# Patient Record
Sex: Female | Born: 1950 | Race: White | Hispanic: No | Smoking: Former smoker
Health system: Southern US, Community
[De-identification: ages and names within clinical notes are randomized; demographics above are authoritative.]

## PROBLEM LIST (undated history)

## (undated) DIAGNOSIS — I499 Cardiac arrhythmia, unspecified: Secondary | ICD-10-CM

## (undated) DIAGNOSIS — F419 Anxiety disorder, unspecified: Secondary | ICD-10-CM

## (undated) DIAGNOSIS — I4891 Unspecified atrial fibrillation: Secondary | ICD-10-CM

## (undated) DIAGNOSIS — F32A Depression, unspecified: Secondary | ICD-10-CM

## (undated) DIAGNOSIS — F329 Major depressive disorder, single episode, unspecified: Secondary | ICD-10-CM

## (undated) DIAGNOSIS — I10 Essential (primary) hypertension: Secondary | ICD-10-CM

## (undated) HISTORY — PX: TONSILLECTOMY: SUR1361

## (undated) HISTORY — PX: TUBAL LIGATION: SHX77

---

## 2010-09-10 HISTORY — PX: RETINAL DETACHMENT SURGERY: SHX105

## 2013-10-14 ENCOUNTER — Other Ambulatory Visit (HOSPITAL_COMMUNITY): Payer: Self-pay | Admitting: Family Medicine

## 2013-10-14 DIAGNOSIS — Z139 Encounter for screening, unspecified: Secondary | ICD-10-CM

## 2013-10-16 ENCOUNTER — Ambulatory Visit (HOSPITAL_COMMUNITY)
Admission: RE | Admit: 2013-10-16 | Discharge: 2013-10-16 | Disposition: A | Discharge status: Home / Self Care | Payer: Medicare Other | Source: Ambulatory Visit | Attending: Family Medicine | Admitting: Family Medicine

## 2013-10-16 DIAGNOSIS — Z1231 Encounter for screening mammogram for malignant neoplasm of breast: Secondary | ICD-10-CM | POA: Insufficient documentation

## 2013-10-16 DIAGNOSIS — Z139 Encounter for screening, unspecified: Secondary | ICD-10-CM

## 2013-10-20 ENCOUNTER — Other Ambulatory Visit: Payer: Self-pay | Admitting: Family Medicine

## 2013-10-20 DIAGNOSIS — R928 Other abnormal and inconclusive findings on diagnostic imaging of breast: Secondary | ICD-10-CM

## 2013-11-03 ENCOUNTER — Other Ambulatory Visit: Payer: Self-pay | Admitting: Family Medicine

## 2013-11-03 ENCOUNTER — Ambulatory Visit (HOSPITAL_COMMUNITY)
Admission: RE | Admit: 2013-11-03 | Discharge: 2013-11-03 | Disposition: A | Discharge status: Home / Self Care | Payer: Medicare Other | Source: Ambulatory Visit | Attending: Family Medicine | Admitting: Family Medicine

## 2013-11-03 ENCOUNTER — Encounter (HOSPITAL_COMMUNITY): Payer: Self-pay

## 2013-11-03 ENCOUNTER — Other Ambulatory Visit (HOSPITAL_COMMUNITY): Payer: Self-pay | Admitting: Family Medicine

## 2013-11-03 VITALS — BP 178/72 | HR 60 | Temp 98.5°F | Resp 16

## 2013-11-03 DIAGNOSIS — R928 Other abnormal and inconclusive findings on diagnostic imaging of breast: Secondary | ICD-10-CM

## 2013-11-03 DIAGNOSIS — IMO0002 Reserved for concepts with insufficient information to code with codable children: Secondary | ICD-10-CM

## 2013-11-03 DIAGNOSIS — D249 Benign neoplasm of unspecified breast: Secondary | ICD-10-CM | POA: Diagnosis not present

## 2013-11-03 DIAGNOSIS — N63 Unspecified lump in unspecified breast: Secondary | ICD-10-CM | POA: Diagnosis present

## 2013-11-03 DIAGNOSIS — R229 Localized swelling, mass and lump, unspecified: Principal | ICD-10-CM

## 2013-11-03 HISTORY — DX: Essential (primary) hypertension: I10

## 2013-11-03 MED ORDER — LIDOCAINE HCL (PF) 2 % IJ SOLN
INTRAMUSCULAR | Status: AC
  Administered 2013-11-03: 10 mL
  Filled 2013-11-03: qty 10

## 2013-11-03 NOTE — Discharge Instructions (Signed)
Breast Biopsy °Care After °These instructions give you information on caring for yourself after your procedure. Your doctor may also give you more specific instructions. Call your doctor if you have any problems or questions after your procedure. °HOME CARE °· Only take medicine as told by your doctor. °· Do not take aspirin. °· Keep your sutures (stitches) dry when bathing. °· Protect the biopsy area. Do not let the area get bumped. °· Avoid activities that could pull the biopsy site open until your doctor approves. This includes: °· Stretching. °· Reaching. °· Exercise. °· Sports. °· Lifting more than 3lb. °· Continue your normal diet. °· Wear a good support bra for as long as told by your doctor. °· Change any bandages (dressings) as told by your doctor. °· Do not drink alcohol while taking pain medicine. °· Keep all doctor visits as told. Ask when your test results will be ready. Make sure you get your test results. °GET HELP RIGHT AWAY IF:  °· You have a fever. °· You have more bleeding (more than a small spot) from the biopsy site. °· You have trouble breathing. °· You have yellowish-white fluid (pus) coming from the biopsy site. °· You have redness, puffiness (swelling), or more pain in the biopsy site. °· You have a bad smell coming from the biopsy site. °· Your biopsy site opens after sutures, staples, or sticky strips have been removed. °· You have a rash. °· You need stronger medicine. °MAKE SURE YOU: °· Understand these instructions. °· Will watch your condition. °· Will get help right away if you are not doing well or get worse. °Document Released: 12/23/2008 Document Revised: 05/21/2011 Document Reviewed: 04/08/2011 °ExitCare® Patient Information ©2015 [employer]. This information is not intended to replace advice given to you by your health care provider. Make sure you discuss any questions you have with your health care provider. ° °Breast Biopsy °A breast biopsy is a test during which a sample of  tissue is taken from your breast. The breast tissue is looked at under a microscope for cancer cells.  °BEFORE THE PROCEDURE °· Make plans to have someone drive you home after the test. °· Do not smoke for 2 weeks before the test. Stop smoking, if you smoke. °· Do not drink alcohol for 24 hours before the test. °· Wear a good support bra to the test. °PROCEDURE  °You may be given one of the following: °· A medicine to numb the breast area (local anesthetic). °· A medicine to make you fall asleep (general anesthetic). °There are different types of breast biopsies. They include: °· Fine-needle aspiration. °¨ A needle is put into the breast lump. °¨ The needle takes out fluid and cells from the lump. °¨ Ultrasound imaging may be used to help find the lump and to put the needle in the right spot. °· Core-needle biopsy. °¨ A needle is put into the breast lump. °¨ The needle is put in your breast 3-6 times. °¨ The needle removes breast tissue. °¨ An ultrasound image or X-ray is often used to find the right spot to put in the needle. °· Stereotactic biopsy. °¨ X-rays and a computer are used to study X-ray pictures of the breast lump. °¨ The computer finds where the needle needs to be put into the breast. °¨ Tissue samples are taken out. °· Vacuum-assisted biopsy. °¨ A small cut (incision) is made in your breast. °¨ A biopsy device is put through the cut and into the breast   tissue. °¨ The biopsy device draws abnormal breast tissue into the biopsy device. °¨ A large tissue sample is often removed. °¨ No stitches are needed. °· Ultrasound-guided core-needle biopsy. °¨ Ultrasound imaging helps guide the needle into the area of the breast that is not normal. °¨ A cut is made in the breast. The needle is put into the breast lump. °¨ Tissue samples are taken out. °· Open biopsy. °¨ A large cut is made in the breast. °¨ Your doctor will try to remove the whole breast lump or as much as possible. °All tissue, fluid, or cell samples  are looked at under a microscope.  °AFTER THE PROCEDURE °· You will be taken to an area to recover. You will be able to go home once you are doing well and are without problems. °· You may have bruising on your breast. This is normal. °· A pressure bandage (dressing) may be put on your breast for 24-48 hours. This type of bandage is wrapped tightly around your chest. It helps stop fluid from building up underneath tissues. °Document Released: 05/21/2011 Document Revised: 07/13/2013 Document Reviewed: 05/21/2011 °ExitCare® Patient Information ©2015 [employer]. This information is not intended to replace advice given to you by your health care provider. Make sure you discuss any questions you have with your health care provider. ° °

## 2013-11-05 HISTORY — PX: BREAST BIOPSY: SHX20

## 2014-04-15 ENCOUNTER — Other Ambulatory Visit (HOSPITAL_COMMUNITY): Payer: Self-pay | Admitting: Family Medicine

## 2014-04-15 DIAGNOSIS — Z09 Encounter for follow-up examination after completed treatment for conditions other than malignant neoplasm: Secondary | ICD-10-CM

## 2014-04-15 DIAGNOSIS — N6310 Unspecified lump in the right breast, unspecified quadrant: Secondary | ICD-10-CM

## 2014-04-15 DIAGNOSIS — D241 Benign neoplasm of right breast: Secondary | ICD-10-CM

## 2014-05-11 ENCOUNTER — Ambulatory Visit (HOSPITAL_COMMUNITY)
Admission: RE | Admit: 2014-05-11 | Discharge: 2014-05-11 | Disposition: A | Discharge status: Home / Self Care | Payer: Medicare Other | Source: Ambulatory Visit | Attending: Family Medicine | Admitting: Family Medicine

## 2014-05-11 DIAGNOSIS — N63 Unspecified lump in breast: Secondary | ICD-10-CM | POA: Insufficient documentation

## 2014-05-11 DIAGNOSIS — R928 Other abnormal and inconclusive findings on diagnostic imaging of breast: Secondary | ICD-10-CM | POA: Diagnosis not present

## 2014-05-11 DIAGNOSIS — Z09 Encounter for follow-up examination after completed treatment for conditions other than malignant neoplasm: Secondary | ICD-10-CM

## 2014-05-11 DIAGNOSIS — D241 Benign neoplasm of right breast: Secondary | ICD-10-CM

## 2014-09-22 ENCOUNTER — Other Ambulatory Visit (HOSPITAL_COMMUNITY): Payer: Self-pay | Admitting: Family Medicine

## 2014-09-22 DIAGNOSIS — Z6827 Body mass index (BMI) 27.0-27.9, adult: Secondary | ICD-10-CM | POA: Diagnosis not present

## 2014-09-22 DIAGNOSIS — I10 Essential (primary) hypertension: Secondary | ICD-10-CM | POA: Diagnosis not present

## 2014-09-22 DIAGNOSIS — F329 Major depressive disorder, single episode, unspecified: Secondary | ICD-10-CM | POA: Diagnosis not present

## 2014-09-22 DIAGNOSIS — Z1389 Encounter for screening for other disorder: Secondary | ICD-10-CM

## 2014-09-22 DIAGNOSIS — Z0000 Encounter for general adult medical examination without abnormal findings: Secondary | ICD-10-CM | POA: Diagnosis not present

## 2014-09-22 DIAGNOSIS — E663 Overweight: Secondary | ICD-10-CM | POA: Diagnosis not present

## 2014-09-28 ENCOUNTER — Ambulatory Visit (HOSPITAL_COMMUNITY)
Admission: RE | Admit: 2014-09-28 | Discharge: 2014-09-28 | Disposition: A | Discharge status: Home / Self Care | Payer: Medicare Other | Source: Ambulatory Visit | Attending: Family Medicine | Admitting: Family Medicine

## 2014-09-28 DIAGNOSIS — M8580 Other specified disorders of bone density and structure, unspecified site: Secondary | ICD-10-CM | POA: Insufficient documentation

## 2014-09-28 DIAGNOSIS — Z13820 Encounter for screening for osteoporosis: Secondary | ICD-10-CM | POA: Insufficient documentation

## 2014-09-28 DIAGNOSIS — M898X8 Other specified disorders of bone, other site: Secondary | ICD-10-CM | POA: Diagnosis not present

## 2014-09-28 DIAGNOSIS — Z1389 Encounter for screening for other disorder: Secondary | ICD-10-CM

## 2014-11-22 DIAGNOSIS — Z8371 Family history of colonic polyps: Secondary | ICD-10-CM | POA: Diagnosis not present

## 2014-11-22 DIAGNOSIS — R194 Change in bowel habit: Secondary | ICD-10-CM | POA: Diagnosis not present

## 2014-12-01 DIAGNOSIS — Z8249 Family history of ischemic heart disease and other diseases of the circulatory system: Secondary | ICD-10-CM | POA: Diagnosis not present

## 2014-12-01 DIAGNOSIS — Z79899 Other long term (current) drug therapy: Secondary | ICD-10-CM | POA: Diagnosis not present

## 2014-12-01 DIAGNOSIS — Z1211 Encounter for screening for malignant neoplasm of colon: Secondary | ICD-10-CM | POA: Diagnosis not present

## 2014-12-01 DIAGNOSIS — K599 Functional intestinal disorder, unspecified: Secondary | ICD-10-CM | POA: Diagnosis not present

## 2014-12-01 DIAGNOSIS — Z8371 Family history of colonic polyps: Secondary | ICD-10-CM | POA: Diagnosis not present

## 2014-12-01 DIAGNOSIS — K644 Residual hemorrhoidal skin tags: Secondary | ICD-10-CM | POA: Diagnosis not present

## 2014-12-01 DIAGNOSIS — D175 Benign lipomatous neoplasm of intra-abdominal organs: Secondary | ICD-10-CM | POA: Diagnosis not present

## 2014-12-01 DIAGNOSIS — R194 Change in bowel habit: Secondary | ICD-10-CM | POA: Diagnosis not present

## 2014-12-01 DIAGNOSIS — F329 Major depressive disorder, single episode, unspecified: Secondary | ICD-10-CM | POA: Diagnosis not present

## 2014-12-01 DIAGNOSIS — I10 Essential (primary) hypertension: Secondary | ICD-10-CM | POA: Diagnosis not present

## 2015-05-10 DIAGNOSIS — Z79899 Other long term (current) drug therapy: Secondary | ICD-10-CM | POA: Diagnosis not present

## 2015-05-10 DIAGNOSIS — S60222A Contusion of left hand, initial encounter: Secondary | ICD-10-CM | POA: Diagnosis not present

## 2015-05-10 DIAGNOSIS — W07XXXA Fall from chair, initial encounter: Secondary | ICD-10-CM | POA: Diagnosis not present

## 2015-05-10 DIAGNOSIS — M79642 Pain in left hand: Secondary | ICD-10-CM | POA: Diagnosis not present

## 2015-05-10 DIAGNOSIS — I10 Essential (primary) hypertension: Secondary | ICD-10-CM | POA: Diagnosis not present

## 2015-06-15 DIAGNOSIS — H524 Presbyopia: Secondary | ICD-10-CM | POA: Diagnosis not present

## 2015-07-12 DIAGNOSIS — Z1389 Encounter for screening for other disorder: Secondary | ICD-10-CM | POA: Diagnosis not present

## 2015-07-12 DIAGNOSIS — I10 Essential (primary) hypertension: Secondary | ICD-10-CM | POA: Diagnosis not present

## 2015-07-12 DIAGNOSIS — R252 Cramp and spasm: Secondary | ICD-10-CM | POA: Diagnosis not present

## 2015-07-12 DIAGNOSIS — M542 Cervicalgia: Secondary | ICD-10-CM | POA: Diagnosis not present

## 2015-09-27 DIAGNOSIS — C4491 Basal cell carcinoma of skin, unspecified: Secondary | ICD-10-CM | POA: Diagnosis not present

## 2015-09-27 DIAGNOSIS — E782 Mixed hyperlipidemia: Secondary | ICD-10-CM | POA: Diagnosis not present

## 2015-09-27 DIAGNOSIS — R011 Cardiac murmur, unspecified: Secondary | ICD-10-CM | POA: Diagnosis not present

## 2015-09-27 DIAGNOSIS — I10 Essential (primary) hypertension: Secondary | ICD-10-CM | POA: Diagnosis not present

## 2015-09-27 DIAGNOSIS — Z0001 Encounter for general adult medical examination with abnormal findings: Secondary | ICD-10-CM | POA: Diagnosis not present

## 2015-09-27 DIAGNOSIS — Z1389 Encounter for screening for other disorder: Secondary | ICD-10-CM | POA: Diagnosis not present

## 2015-09-27 DIAGNOSIS — L570 Actinic keratosis: Secondary | ICD-10-CM | POA: Diagnosis not present

## 2015-10-04 DIAGNOSIS — R748 Abnormal levels of other serum enzymes: Secondary | ICD-10-CM | POA: Diagnosis not present

## 2015-12-12 DIAGNOSIS — Z23 Encounter for immunization: Secondary | ICD-10-CM | POA: Diagnosis not present

## 2015-12-14 ENCOUNTER — Other Ambulatory Visit (HOSPITAL_COMMUNITY): Payer: Self-pay | Admitting: Family Medicine

## 2015-12-14 DIAGNOSIS — Z1231 Encounter for screening mammogram for malignant neoplasm of breast: Secondary | ICD-10-CM

## 2015-12-22 ENCOUNTER — Ambulatory Visit (HOSPITAL_COMMUNITY)
Admission: RE | Admit: 2015-12-22 | Discharge: 2015-12-22 | Disposition: A | Discharge status: Home / Self Care | Payer: Medicare Other | Source: Ambulatory Visit | Attending: Family Medicine | Admitting: Family Medicine

## 2015-12-22 DIAGNOSIS — Z1231 Encounter for screening mammogram for malignant neoplasm of breast: Secondary | ICD-10-CM

## 2016-08-29 DIAGNOSIS — H524 Presbyopia: Secondary | ICD-10-CM | POA: Diagnosis not present

## 2016-08-29 DIAGNOSIS — H2513 Age-related nuclear cataract, bilateral: Secondary | ICD-10-CM | POA: Diagnosis not present

## 2016-08-29 DIAGNOSIS — H25043 Posterior subcapsular polar age-related cataract, bilateral: Secondary | ICD-10-CM | POA: Diagnosis not present

## 2016-09-18 DIAGNOSIS — H25811 Combined forms of age-related cataract, right eye: Secondary | ICD-10-CM | POA: Diagnosis not present

## 2016-09-18 DIAGNOSIS — H2511 Age-related nuclear cataract, right eye: Secondary | ICD-10-CM | POA: Diagnosis not present

## 2016-09-18 DIAGNOSIS — H25041 Posterior subcapsular polar age-related cataract, right eye: Secondary | ICD-10-CM | POA: Diagnosis not present

## 2016-09-20 DIAGNOSIS — Z1389 Encounter for screening for other disorder: Secondary | ICD-10-CM | POA: Diagnosis not present

## 2016-09-20 DIAGNOSIS — I10 Essential (primary) hypertension: Secondary | ICD-10-CM | POA: Diagnosis not present

## 2016-12-04 DIAGNOSIS — H2512 Age-related nuclear cataract, left eye: Secondary | ICD-10-CM | POA: Diagnosis not present

## 2016-12-04 DIAGNOSIS — H25042 Posterior subcapsular polar age-related cataract, left eye: Secondary | ICD-10-CM | POA: Diagnosis not present

## 2016-12-04 DIAGNOSIS — H25812 Combined forms of age-related cataract, left eye: Secondary | ICD-10-CM | POA: Diagnosis not present

## 2016-12-15 DIAGNOSIS — Z23 Encounter for immunization: Secondary | ICD-10-CM | POA: Diagnosis not present

## 2017-02-28 DIAGNOSIS — Z0001 Encounter for general adult medical examination with abnormal findings: Secondary | ICD-10-CM | POA: Diagnosis not present

## 2017-02-28 DIAGNOSIS — Z1389 Encounter for screening for other disorder: Secondary | ICD-10-CM | POA: Diagnosis not present

## 2017-02-28 DIAGNOSIS — E782 Mixed hyperlipidemia: Secondary | ICD-10-CM | POA: Diagnosis not present

## 2017-02-28 DIAGNOSIS — I10 Essential (primary) hypertension: Secondary | ICD-10-CM | POA: Diagnosis not present

## 2017-03-15 ENCOUNTER — Other Ambulatory Visit (HOSPITAL_COMMUNITY): Payer: Self-pay | Admitting: Family Medicine

## 2017-03-15 DIAGNOSIS — Z1231 Encounter for screening mammogram for malignant neoplasm of breast: Secondary | ICD-10-CM

## 2017-03-18 ENCOUNTER — Ambulatory Visit (HOSPITAL_COMMUNITY)
Admission: RE | Admit: 2017-03-18 | Discharge: 2017-03-18 | Disposition: A | Discharge status: Home / Self Care | Payer: Medicare Other | Source: Ambulatory Visit | Attending: Family Medicine | Admitting: Family Medicine

## 2017-03-18 DIAGNOSIS — Z1231 Encounter for screening mammogram for malignant neoplasm of breast: Secondary | ICD-10-CM | POA: Insufficient documentation

## 2017-06-10 DIAGNOSIS — Z1389 Encounter for screening for other disorder: Secondary | ICD-10-CM | POA: Diagnosis not present

## 2017-06-10 DIAGNOSIS — R05 Cough: Secondary | ICD-10-CM | POA: Diagnosis not present

## 2017-06-19 DIAGNOSIS — N183 Chronic kidney disease, stage 3 (moderate): Secondary | ICD-10-CM | POA: Diagnosis not present

## 2017-06-19 DIAGNOSIS — I10 Essential (primary) hypertension: Secondary | ICD-10-CM | POA: Diagnosis not present

## 2017-07-03 ENCOUNTER — Other Ambulatory Visit: Payer: Self-pay | Admitting: Nephrology

## 2017-07-03 ENCOUNTER — Other Ambulatory Visit (HOSPITAL_COMMUNITY): Payer: Self-pay | Admitting: Nephrology

## 2017-07-03 DIAGNOSIS — N183 Chronic kidney disease, stage 3 unspecified: Secondary | ICD-10-CM

## 2017-07-16 ENCOUNTER — Ambulatory Visit (HOSPITAL_COMMUNITY)
Admission: RE | Admit: 2017-07-16 | Discharge: 2017-07-16 | Disposition: A | Discharge status: Home / Self Care | Payer: Medicare Other | Source: Ambulatory Visit | Attending: Nephrology | Admitting: Nephrology

## 2017-07-16 DIAGNOSIS — N183 Chronic kidney disease, stage 3 unspecified: Secondary | ICD-10-CM

## 2017-07-16 DIAGNOSIS — D509 Iron deficiency anemia, unspecified: Secondary | ICD-10-CM | POA: Diagnosis not present

## 2017-07-16 DIAGNOSIS — N281 Cyst of kidney, acquired: Secondary | ICD-10-CM | POA: Insufficient documentation

## 2017-07-16 DIAGNOSIS — R809 Proteinuria, unspecified: Secondary | ICD-10-CM | POA: Diagnosis not present

## 2017-07-16 DIAGNOSIS — Z1159 Encounter for screening for other viral diseases: Secondary | ICD-10-CM | POA: Diagnosis not present

## 2017-07-16 DIAGNOSIS — Z79899 Other long term (current) drug therapy: Secondary | ICD-10-CM | POA: Diagnosis not present

## 2017-07-16 DIAGNOSIS — I10 Essential (primary) hypertension: Secondary | ICD-10-CM | POA: Diagnosis not present

## 2017-07-16 DIAGNOSIS — E559 Vitamin D deficiency, unspecified: Secondary | ICD-10-CM | POA: Diagnosis not present

## 2017-07-24 DIAGNOSIS — E875 Hyperkalemia: Secondary | ICD-10-CM | POA: Diagnosis not present

## 2017-07-24 DIAGNOSIS — E559 Vitamin D deficiency, unspecified: Secondary | ICD-10-CM | POA: Diagnosis not present

## 2017-07-24 DIAGNOSIS — N183 Chronic kidney disease, stage 3 (moderate): Secondary | ICD-10-CM | POA: Diagnosis not present

## 2017-10-31 DIAGNOSIS — R809 Proteinuria, unspecified: Secondary | ICD-10-CM | POA: Diagnosis not present

## 2017-10-31 DIAGNOSIS — Z79899 Other long term (current) drug therapy: Secondary | ICD-10-CM | POA: Diagnosis not present

## 2017-10-31 DIAGNOSIS — E559 Vitamin D deficiency, unspecified: Secondary | ICD-10-CM | POA: Diagnosis not present

## 2017-10-31 DIAGNOSIS — N183 Chronic kidney disease, stage 3 (moderate): Secondary | ICD-10-CM | POA: Diagnosis not present

## 2017-10-31 DIAGNOSIS — D509 Iron deficiency anemia, unspecified: Secondary | ICD-10-CM | POA: Diagnosis not present

## 2017-10-31 DIAGNOSIS — I10 Essential (primary) hypertension: Secondary | ICD-10-CM | POA: Diagnosis not present

## 2017-11-06 DIAGNOSIS — E559 Vitamin D deficiency, unspecified: Secondary | ICD-10-CM | POA: Diagnosis not present

## 2017-11-06 DIAGNOSIS — N183 Chronic kidney disease, stage 3 (moderate): Secondary | ICD-10-CM | POA: Diagnosis not present

## 2017-12-10 DIAGNOSIS — I4891 Unspecified atrial fibrillation: Secondary | ICD-10-CM

## 2017-12-10 DIAGNOSIS — Z23 Encounter for immunization: Secondary | ICD-10-CM | POA: Diagnosis not present

## 2017-12-10 HISTORY — DX: Unspecified atrial fibrillation: I48.91

## 2018-01-09 DIAGNOSIS — N182 Chronic kidney disease, stage 2 (mild): Secondary | ICD-10-CM | POA: Diagnosis not present

## 2018-01-09 DIAGNOSIS — Z0001 Encounter for general adult medical examination with abnormal findings: Secondary | ICD-10-CM | POA: Diagnosis not present

## 2018-01-09 DIAGNOSIS — R7309 Other abnormal glucose: Secondary | ICD-10-CM | POA: Diagnosis not present

## 2018-01-09 DIAGNOSIS — I10 Essential (primary) hypertension: Secondary | ICD-10-CM | POA: Diagnosis not present

## 2018-01-09 DIAGNOSIS — L919 Hypertrophic disorder of the skin, unspecified: Secondary | ICD-10-CM | POA: Diagnosis not present

## 2018-01-09 DIAGNOSIS — Z1389 Encounter for screening for other disorder: Secondary | ICD-10-CM | POA: Diagnosis not present

## 2018-01-09 DIAGNOSIS — D485 Neoplasm of uncertain behavior of skin: Secondary | ICD-10-CM | POA: Diagnosis not present

## 2018-01-09 DIAGNOSIS — E782 Mixed hyperlipidemia: Secondary | ICD-10-CM | POA: Diagnosis not present

## 2018-01-09 DIAGNOSIS — R739 Hyperglycemia, unspecified: Secondary | ICD-10-CM | POA: Diagnosis not present

## 2018-01-29 DIAGNOSIS — X32XXXA Exposure to sunlight, initial encounter: Secondary | ICD-10-CM | POA: Diagnosis not present

## 2018-01-29 DIAGNOSIS — C44219 Basal cell carcinoma of skin of left ear and external auricular canal: Secondary | ICD-10-CM | POA: Diagnosis not present

## 2018-01-29 DIAGNOSIS — D225 Melanocytic nevi of trunk: Secondary | ICD-10-CM | POA: Diagnosis not present

## 2018-01-29 DIAGNOSIS — L570 Actinic keratosis: Secondary | ICD-10-CM | POA: Diagnosis not present

## 2018-02-17 ENCOUNTER — Encounter: Payer: Self-pay | Admitting: *Deleted

## 2018-02-18 ENCOUNTER — Encounter: Payer: Self-pay | Admitting: Cardiology

## 2018-02-18 ENCOUNTER — Ambulatory Visit: Payer: Medicare Other | Admitting: Cardiology

## 2018-02-18 VITALS — BP 146/90 | HR 92 | Ht 60.0 in | Wt 172.0 lb

## 2018-02-18 DIAGNOSIS — R0609 Other forms of dyspnea: Secondary | ICD-10-CM

## 2018-02-18 DIAGNOSIS — I4891 Unspecified atrial fibrillation: Secondary | ICD-10-CM

## 2018-02-18 DIAGNOSIS — I10 Essential (primary) hypertension: Secondary | ICD-10-CM | POA: Insufficient documentation

## 2018-02-18 DIAGNOSIS — I4819 Other persistent atrial fibrillation: Secondary | ICD-10-CM | POA: Diagnosis not present

## 2018-02-18 MED ORDER — RIVAROXABAN 20 MG PO TABS: 20.0000 mg | ORAL_TABLET | Freq: Every day | ORAL | 0 refills | Status: DC

## 2018-02-18 MED ORDER — RIVAROXABAN 20 MG PO TABS: 20.0000 mg | ORAL_TABLET | Freq: Every day | ORAL | 3 refills | Status: DC

## 2018-02-18 MED ORDER — METOPROLOL TARTRATE 25 MG PO TABS: 25.0000 mg | ORAL_TABLET | Freq: Two times a day (BID) | ORAL | 0 refills | Status: DC

## 2018-02-18 MED ORDER — METOPROLOL TARTRATE 25 MG PO TABS: 25.0000 mg | ORAL_TABLET | Freq: Two times a day (BID) | ORAL | 1 refills | Status: DC

## 2018-02-18 NOTE — Progress Notes (Signed)
     Clinical Summary Stephanie Hale is a 67 y.o.female seen as new consult, referred by Dr Hilma Favors for afib  1.Persistent Afib - new diagnosis during recent pcp visit - no palpitations. Some fatigue, DOE for example unloading groceries. Some weight gain over the same time period.  - did not start xarelto as prescribed by pcp CHADS2Vasc (age, gender, HTN) is 3.  CrCl 55       Past Medical History:  Diagnosis Date  . Hypertension      No Known Allergies   Current Outpatient Medications  Medication Sig Dispense Refill  . hydrochlorothiazide (MICROZIDE) 12.5 MG capsule Take 12.5 mg by mouth daily.    Marland Kitchen losartan (COZAAR) 50 MG tablet Take 50 mg by mouth daily.    . Rivaroxaban (XARELTO PO) Take by mouth.     No current facility-administered medications for this visit.         No Known Allergies    Family History  Problem Relation Age of Onset  . Heart attack Father   . CAD Father   . Cancer Brother      Social History Stephanie Hale has no tobacco history on file. Stephanie Hale has no alcohol history on file.   Review of Systems CONSTITUTIONAL: No weight loss, fever, chills, weakness or fatigue.  HEENT: Eyes: No visual loss, blurred vision, double vision or yellow sclerae.No hearing loss, sneezing, congestion, runny nose or sore throat.  SKIN: No rash or itching.  CARDIOVASCULAR: per hpi RESPIRATORY: per hpi GASTROINTESTINAL: No anorexia, nausea, vomiting or diarrhea. No abdominal pain or blood.  GENITOURINARY: No burning on urination, no polyuria NEUROLOGICAL: No headache, dizziness, syncope, paralysis, ataxia, numbness or tingling in the extremities. No change in bowel or bladder control.  MUSCULOSKELETAL: No muscle, back pain, joint pain or stiffness.  LYMPHATICS: No enlarged nodes. No history of splenectomy.  PSYCHIATRIC: No history of depression or anxiety.  ENDOCRINOLOGIC: No reports of sweating, cold or heat intolerance. No polyuria or polydipsia.   Marland Kitchen   Physical Examination Vitals:   02/18/18 0902 02/18/18 0912  BP: (!) 144/94 (!) 146/90  Pulse: 94 92  SpO2: 98% 96%   Vitals:   02/18/18 0902  Weight: 172 lb (78 kg)  Height: 5' (1.524 m)    Gen: resting comfortably, no acute distress HEENT: no scleral icterus, pupils equal round and reactive, no palptable cervical adenopathy,  CV: irreg, no no m/r/g, no jvd Resp: Clear to auscultation bilaterally GI: abdomen is soft, non-tender, non-distended, normal bowel sounds, no hepatosplenomegaly MSK: extremities are warm, no edema.  Skin: warm, no rash Neuro:  no focal deficits Psych: appropriate affect     Assessment and Plan  1. Persistent afib - new diagnosis recently by pcp. EKG today shows she remains in afib with mildly elevated rate - no specific palpitatiions, but could be related to her DOE and fatigue - start lopressor 25mg  bid - CHADS2Vasc score is 3, start xarelto 20mg  daily.  - nursing visit 3 weeks for vitals and EKG, if still in afib would plan for DCCV - obtain echo   F/u 2 months. If still in afib at nursing visit after 3 weeks of anticoag plan for DCCV         Arnoldo Lenis, M.D.

## 2018-02-18 NOTE — Patient Instructions (Signed)
Your physician recommends that you schedule a follow-up appointment in: [place]  Your physician has recommended you make the following change in your medication:   START LOPRESSOR 25 MG TWICE DAILY   START XARELTO 20 MG DAILY   Your physician has requested that you have an echocardiogram. Echocardiography is a painless test that uses sound waves to create images of your heart. It provides your doctor with information about the size and shape of your heart and how well your heart's chambers and valves are working. This procedure takes approximately one hour. There are no restrictions for this procedure.  Thank you for choosing [facility]!!

## 2018-03-10 DIAGNOSIS — L570 Actinic keratosis: Secondary | ICD-10-CM | POA: Diagnosis not present

## 2018-03-10 DIAGNOSIS — X32XXXD Exposure to sunlight, subsequent encounter: Secondary | ICD-10-CM | POA: Diagnosis not present

## 2018-03-10 DIAGNOSIS — Z08 Encounter for follow-up examination after completed treatment for malignant neoplasm: Secondary | ICD-10-CM | POA: Diagnosis not present

## 2018-03-10 DIAGNOSIS — Z85828 Personal history of other malignant neoplasm of skin: Secondary | ICD-10-CM | POA: Diagnosis not present

## 2018-03-11 ENCOUNTER — Ambulatory Visit (INDEPENDENT_AMBULATORY_CARE_PROVIDER_SITE_OTHER): Payer: Medicare Other | Admitting: *Deleted

## 2018-03-11 ENCOUNTER — Other Ambulatory Visit: Payer: Self-pay

## 2018-03-11 ENCOUNTER — Ambulatory Visit (INDEPENDENT_AMBULATORY_CARE_PROVIDER_SITE_OTHER): Payer: Medicare Other

## 2018-03-11 VITALS — BP 132/84 | HR 79

## 2018-03-11 DIAGNOSIS — I4891 Unspecified atrial fibrillation: Secondary | ICD-10-CM | POA: Diagnosis not present

## 2018-03-11 LAB — ECHOCARDIOGRAM COMPLETE

## 2018-03-11 NOTE — Progress Notes (Signed)
ECG demonstrates rate controlled atrial fibrillation.  As per Dr. Nelly Laurence note, scheduled for direct-current cardioversion with him.

## 2018-03-11 NOTE — Progress Notes (Signed)
Patient in office for vitals & EKG per Dr. Harl Bowie.  EKG scanned into Epic.

## 2018-03-13 ENCOUNTER — Telehealth: Payer: Self-pay | Admitting: *Deleted

## 2018-03-13 NOTE — Telephone Encounter (Signed)
Notes recorded by Laurine Blazer, LPN on 07/16/4933 at 5:21 PM EST Patient notified. Copy to pmd. Follow up scheduled for 2/20 with Dr. Harl Bowie. ------  Notes recorded by Herminio Commons, MD on 03/11/2018 at 4:41 PM EST Normal pumping function with mild mitral valve leakage.

## 2018-03-14 ENCOUNTER — Telehealth: Payer: Self-pay | Admitting: Cardiology

## 2018-03-14 NOTE — Progress Notes (Signed)
Patient notified last evening.

## 2018-03-14 NOTE — Telephone Encounter (Signed)
Patient is requesting that her cardioversion be scheduled on a Friday.

## 2018-03-14 NOTE — Telephone Encounter (Signed)
Patient called stating that she needs to speak with someone about upcoming procedure to be done at [facility].

## 2018-03-21 NOTE — Telephone Encounter (Signed)
Left message to return call 

## 2018-03-21 NOTE — Telephone Encounter (Signed)
Sarita Bottom, LPN        Good Morning,   DCCV is scheduled for Friday 04/04/18 @ 9:30am. Needs to register @ 8:30am at [facility]. Pre Op scheduled for Monday 03/31/18 @ 2:45pm at [facility].   Thanks,  Coralyn Mark   Previous Messages    ----- Message -----  From: Laurine Blazer, LPN  Sent: 03/17/6151  5:46 PM EST  To: Orinda Kenner  Subject: schedule DCCV                  Need to schedule Cardioversion for AFib for Dr. Harl Bowie. Patient is requesting to be done on a Friday.    Thanks,   Edd Fabian

## 2018-03-24 ENCOUNTER — Encounter: Payer: Self-pay | Admitting: *Deleted

## 2018-03-24 ENCOUNTER — Other Ambulatory Visit: Payer: Self-pay | Admitting: Cardiology

## 2018-03-24 NOTE — Telephone Encounter (Signed)
Patient notified and verbalized understanding. 

## 2018-03-24 NOTE — Patient Instructions (Signed)
Stephanie Hale  03/24/2018     @PREFPERIOPPHARMACY @   Your procedure is scheduled on  04/04/2018  Report to [facility] at  800  A.M.  Call this number if you have problems the morning of surgery:  [phone]   Remember:  Do not eat or drink after midnight.                         Take these medicines the morning of surgery with A SIP OF WATER  Zyrtec, losartan.    Do not wear jewelry, make-up or nail polish.  Do not wear lotions, powders, or perfumes, or deodorant.  Do not shave 48 hours prior to surgery.  Men may shave face and neck.  Do not bring valuables to the hospital.  [facility] is not responsible for any belongings or valuables.  Contacts, dentures or bridgework may not be worn into surgery.  Leave your suitcase in the car.  After surgery it may be brought to your room.  For patients admitted to the hospital, discharge time will be determined by your treatment team.  Patients discharged the day of surgery will not be allowed to drive home.   Name and phone number of your driver:   family Special instructions: DO NOT miss any doses of your xarelto before your procedure.  Please read over the following fact sheets that you were given. Anesthesia Post-op Instructions and Care and Recovery After Surgery       Electrical Cardioversion  Electrical cardioversion is the delivery of a jolt of electricity to restore a normal rhythm to the heart. A rhythm that is too fast or is not regular keeps the heart from pumping well. In this procedure, sticky patches or metal paddles are placed on the chest to deliver electricity to the heart from a device. This procedure may be done in an emergency if:  There is low or no blood pressure as a result of the heart rhythm.  Normal rhythm must be restored as fast as possible to protect the brain and heart from further damage.  It may save a life. This procedure may also be done for irregular or fast heart  rhythms that are not immediately life-threatening. Tell a health care provider about:  Any allergies you have.  All medicines you are taking, including vitamins, herbs, eye drops, creams, and over-the-counter medicines.  Any problems you or family members have had with anesthetic medicines.  Any blood disorders you have.  Any surgeries you have had.  Any medical conditions you have.  Whether you are pregnant or may be pregnant. What are the risks? Generally, this is a safe procedure. However, problems may occur, including:  Allergic reactions to medicines.  A blood clot that breaks free and travels to other parts of your body.  The possible return of an abnormal heart rhythm within hours or days after the procedure.  Your heart stopping (cardiac arrest). This is rare. What happens before the procedure? Medicines  Your health care provider may have you start taking: ? Blood-thinning medicines (anticoagulants) so your blood does not clot as easily. ? Medicines may be given to help stabilize your heart rate and rhythm.  Ask your health care provider about changing or stopping your regular medicines. This is especially important if you are taking diabetes medicines or blood thinners. General instructions  Plan to have someone take you home from the hospital  or clinic.  If you will be going home right after the procedure, plan to have someone with you for 24 hours.  Follow instructions from your health care provider about eating or drinking restrictions. What happens during the procedure?  To lower your risk of infection: ? Your health care team will wash or sanitize their hands. ? Your skin will be washed with soap.  An IV tube will be inserted into one of your veins.  You will be given a medicine to help you relax (sedative).  Sticky patches (electrodes) or metal paddles may be placed on your chest.  An electrical shock will be delivered. The procedure may vary  among health care providers and hospitals. What happens after the procedure?   Your blood pressure, heart rate, breathing rate, and blood oxygen level will be monitored until the medicines you were given have worn off.  Do not drive for 24 hours if you were given a sedative.  Your heart rhythm will be watched to make sure it does not change. This information is not intended to replace advice given to you by your health care provider. Make sure you discuss any questions you have with your health care provider. Document Released: 02/16/2002 Document Revised: 10/26/2015 Document Reviewed: 09/02/2015 Elsevier Interactive Patient Education  2019 [employer].  Electrical Cardioversion, Care After This sheet gives you information about how to care for yourself after your procedure. Your health care provider may also give you more specific instructions. If you have problems or questions, contact your health care provider. What can I expect after the procedure? After the procedure, it is common to have:  Some redness on the skin where the shocks were given. Follow these instructions at home:   Do not drive for 24 hours if you were given a medicine to help you relax (sedative).  Take over-the-counter and prescription medicines only as told by your health care provider.  Ask your health care provider how to check your pulse. Check it often.  Rest for 48 hours after the procedure or as told by your health care provider.  Avoid or limit your caffeine use as told by your health care provider. Contact a health care provider if:  You feel like your heart is beating too quickly or your pulse is not regular.  You have a serious muscle cramp that does not go away. Get help right away if:   You have discomfort in your chest.  You are dizzy or you feel faint.  You have trouble breathing or you are short of breath.  Your speech is slurred.  You have trouble moving an arm or leg on one side  of your body.  Your fingers or toes turn cold or blue. This information is not intended to replace advice given to you by your health care provider. Make sure you discuss any questions you have with your health care provider. Document Released: 12/17/2012 Document Revised: 09/30/2015 Document Reviewed: 09/02/2015 Elsevier Interactive Patient Education  2019 [place] Anesthesia is a term that refers to techniques, procedures, and medicines that help a person stay safe and comfortable during a medical procedure. Monitored anesthesia care, or sedation, is one type of anesthesia. Your anesthesia specialist may recommend sedation if you will be having a procedure that does not require you to be unconscious, such as:  Cataract surgery.  A dental procedure.  A biopsy.  A colonoscopy. During the procedure, you may receive a medicine to help you relax (  sedative). There are three levels of sedation:  Mild sedation. At this level, you may feel awake and relaxed. You will be able to follow directions.  Moderate sedation. At this level, you will be sleepy. You may not remember the procedure.  Deep sedation. At this level, you will be asleep. You will not remember the procedure. The more medicine you are given, the deeper your level of sedation will be. Depending on how you respond to the procedure, the anesthesia specialist may change your level of sedation or the type of anesthesia to fit your needs. An anesthesia specialist will monitor you closely during the procedure. Let your health care provider know about:  Any allergies you have.  All medicines you are taking, including vitamins, herbs, eye drops, creams, and over-the-counter medicines.  Any use of steroids (by mouth or as a cream).  Any problems you or family members have had with sedatives and anesthetic medicines.  Any blood disorders you have.  Any surgeries you have had.  Any medical conditions  you have, such as sleep apnea.  Whether you are pregnant or may be pregnant.  Any use of cigarettes, alcohol, or street drugs. What are the risks? Generally, this is a safe procedure. However, problems may occur, including:  Getting too much medicine (oversedation).  Nausea.  Allergic reaction to medicines.  Trouble breathing. If this happens, a breathing tube may be used to help with breathing. It will be removed when you are awake and breathing on your own.  Heart trouble.  Lung trouble. Before the procedure Staying hydrated Follow instructions from your health care provider about hydration, which may include:  Up to 2 hours before the procedure - you may continue to drink clear liquids, such as water, clear fruit juice, black coffee, and plain tea. Eating and drinking restrictions Follow instructions from your health care provider about eating and drinking, which may include:  8 hours before the procedure - stop eating heavy meals or foods such as meat, fried foods, or fatty foods.  6 hours before the procedure - stop eating light meals or foods, such as toast or cereal.  6 hours before the procedure - stop drinking milk or drinks that contain milk.  2 hours before the procedure - stop drinking clear liquids. Medicines Ask your health care provider about:  Changing or stopping your regular medicines. This is especially important if you are taking diabetes medicines or blood thinners.  Taking medicines such as aspirin and ibuprofen. These medicines can thin your blood. Do not take these medicines before your procedure if your health care provider instructs you not to. Tests and exams  You will have a physical exam.  You may have blood tests done to show: ? How well your kidneys and liver are working. ? How well your blood can clot. General instructions  Plan to have someone take you home from the hospital or clinic.  If you will be going home right after the  procedure, plan to have someone with you for 24 hours.  What happens during the procedure?  Your blood pressure, heart rate, breathing, level of pain and overall condition will be monitored.  An IV tube will be inserted into one of your veins.  Your anesthesia specialist will give you medicines as needed to keep you comfortable during the procedure. This may mean changing the level of sedation.  The procedure will be performed. After the procedure  Your blood pressure, heart rate, breathing rate, and blood oxygen level  will be monitored until the medicines you were given have worn off.  Do not drive for 24 hours if you received a sedative.  You may: ? Feel sleepy, clumsy, or nauseous. ? Feel forgetful about what happened after the procedure. ? Have a sore throat if you had a breathing tube during the procedure. ? Vomit. This information is not intended to replace advice given to you by your health care provider. Make sure you discuss any questions you have with your health care provider. Document Released: 11/22/2004 Document Revised: 08/05/2015 Document Reviewed: 06/19/2015 Elsevier Interactive Patient Education  2019 [place], Care After These instructions provide you with information about caring for yourself after your procedure. Your health care provider may also give you more specific instructions. Your treatment has been planned according to current medical practices, but problems sometimes occur. Call your health care provider if you have any problems or questions after your procedure. What can I expect after the procedure? After your procedure, you may:  Feel sleepy for several hours.  Feel clumsy and have poor balance for several hours.  Feel forgetful about what happened after the procedure.  Have poor judgment for several hours.  Feel nauseous or vomit.  Have a sore throat if you had a breathing tube during the procedure. Follow  these instructions at home: For at least 24 hours after the procedure:      Have a responsible adult stay with you. It is important to have someone help care for you until you are awake and alert.  Rest as needed.  Do not: ? Participate in activities in which you could fall or become injured. ? Drive. ? Use heavy machinery. ? Drink alcohol. ? Take sleeping pills or medicines that cause drowsiness. ? Make important decisions or sign legal documents. ? Take care of children on your own. Eating and drinking  Follow the diet that is recommended by your health care provider.  If you vomit, drink water, juice, or soup when you can drink without vomiting.  Make sure you have little or no nausea before eating solid foods. General instructions  Take over-the-counter and prescription medicines only as told by your health care provider.  If you have sleep apnea, surgery and certain medicines can increase your risk for breathing problems. Follow instructions from your health care provider about wearing your sleep device: ? Anytime you are sleeping, including during daytime naps. ? While taking prescription pain medicines, sleeping medicines, or medicines that make you drowsy.  If you smoke, do not smoke without supervision.  Keep all follow-up visits as told by your health care provider. This is important. Contact a health care provider if:  You keep feeling nauseous or you keep vomiting.  You feel light-headed.  You develop a rash.  You have a fever. Get help right away if:  You have trouble breathing. Summary  For several hours after your procedure, you may feel sleepy and have poor judgment.  Have a responsible adult stay with you for at least 24 hours or until you are awake and alert. This information is not intended to replace advice given to you by your health care provider. Make sure you discuss any questions you have with your health care provider. Document Released:  06/19/2015 Document Revised: 10/12/2016 Document Reviewed: 06/19/2015 Elsevier Interactive Patient Education  2019 [employer].

## 2018-03-24 NOTE — Telephone Encounter (Signed)
Thx, I have placed orders   Zandra Abts MD

## 2018-03-31 ENCOUNTER — Encounter (HOSPITAL_COMMUNITY): Payer: Self-pay

## 2018-03-31 ENCOUNTER — Encounter (HOSPITAL_COMMUNITY)
Admission: RE | Admit: 2018-03-31 | Discharge: 2018-03-31 | Disposition: A | Discharge status: Home / Self Care | Payer: Medicare Other | Source: Ambulatory Visit | Attending: Cardiology | Admitting: Cardiology

## 2018-03-31 ENCOUNTER — Other Ambulatory Visit: Payer: Self-pay

## 2018-03-31 DIAGNOSIS — Z01812 Encounter for preprocedural laboratory examination: Secondary | ICD-10-CM | POA: Diagnosis not present

## 2018-03-31 HISTORY — DX: Unspecified atrial fibrillation: I48.91

## 2018-03-31 HISTORY — DX: Major depressive disorder, single episode, unspecified: F32.9

## 2018-03-31 HISTORY — DX: Cardiac arrhythmia, unspecified: I49.9

## 2018-03-31 HISTORY — DX: Depression, unspecified: F32.A

## 2018-03-31 HISTORY — DX: Anxiety disorder, unspecified: F41.9

## 2018-03-31 LAB — BASIC METABOLIC PANEL
Anion gap: 8 (ref 5–15)
BUN: 21 mg/dL (ref 8–23)
CO2: 26 mmol/L (ref 22–32)
Calcium: 9.4 mg/dL (ref 8.9–10.3)
Chloride: 105 mmol/L (ref 98–111)
Creatinine, Ser: 1.13 mg/dL — ABNORMAL HIGH (ref 0.44–1.00)
GFR, EST AFRICAN AMERICAN: 57 mL/min — AB (ref >60)
GFR, EST NON AFRICAN AMERICAN: 50 mL/min — AB (ref >60)
Glucose, Bld: 121 mg/dL — ABNORMAL HIGH (ref 70–99)
Potassium: 3.3 mmol/L — ABNORMAL LOW (ref 3.5–5.1)
Sodium: 139 mmol/L (ref 135–145)

## 2018-04-02 ENCOUNTER — Telehealth: Payer: Self-pay

## 2018-04-02 MED ORDER — POTASSIUM CHLORIDE CRYS ER 20 MEQ PO TBCR: EXTENDED_RELEASE_TABLET | ORAL | 0 refills | Status: DC

## 2018-04-02 NOTE — Telephone Encounter (Signed)
Pt made aware. Copy to pcp. Sent RX for potassium to [facility] in [place],.

## 2018-04-02 NOTE — Telephone Encounter (Signed)
-----   Message from Arnoldo Lenis, MD sent at 04/02/2018 12:31 PM EST ----- Pottasium mildly low, take KCl 5mEq daily x 3 days.    Zandra Abts MD

## 2018-04-02 NOTE — Telephone Encounter (Signed)
-----   Message from Arnoldo Lenis, MD sent at 04/02/2018 12:31 PM EST ----- Pottasium mildly low, take KCl 78mEq daily x 3 days.    Zandra Abts MD

## 2018-04-04 ENCOUNTER — Encounter (HOSPITAL_COMMUNITY): Payer: Self-pay | Admitting: *Deleted

## 2018-04-04 ENCOUNTER — Ambulatory Visit (HOSPITAL_COMMUNITY)
Admission: RE | Admit: 2018-04-04 | Discharge: 2018-04-04 | Disposition: A | Discharge status: Home / Self Care | Payer: Medicare Other | Attending: Cardiology | Admitting: Cardiology

## 2018-04-04 ENCOUNTER — Encounter (HOSPITAL_COMMUNITY)
Admission: RE | Disposition: A | Discharge status: Home / Self Care | Payer: Self-pay | Source: Home / Self Care | Attending: Cardiology

## 2018-04-04 ENCOUNTER — Ambulatory Visit (HOSPITAL_COMMUNITY): Payer: Medicare Other | Admitting: Anesthesiology

## 2018-04-04 DIAGNOSIS — I4819 Other persistent atrial fibrillation: Secondary | ICD-10-CM | POA: Insufficient documentation

## 2018-04-04 DIAGNOSIS — Z7901 Long term (current) use of anticoagulants: Secondary | ICD-10-CM | POA: Diagnosis not present

## 2018-04-04 DIAGNOSIS — I4891 Unspecified atrial fibrillation: Secondary | ICD-10-CM | POA: Diagnosis not present

## 2018-04-04 DIAGNOSIS — Z87891 Personal history of nicotine dependence: Secondary | ICD-10-CM | POA: Insufficient documentation

## 2018-04-04 DIAGNOSIS — I10 Essential (primary) hypertension: Secondary | ICD-10-CM | POA: Insufficient documentation

## 2018-04-04 DIAGNOSIS — Z79899 Other long term (current) drug therapy: Secondary | ICD-10-CM | POA: Insufficient documentation

## 2018-04-04 HISTORY — PX: CARDIOVERSION: SHX1299

## 2018-04-04 SURGERY — CARDIOVERSION
Anesthesia: Monitor Anesthesia Care

## 2018-04-04 MED ORDER — PROPOFOL 500 MG/50ML IV EMUL
INTRAVENOUS | Status: DC | PRN
  Administered 2018-04-04: 125 ug/kg/min via INTRAVENOUS

## 2018-04-04 MED ORDER — FENTANYL CITRATE (PF) 100 MCG/2ML IJ SOLN
INTRAMUSCULAR | Status: DC | PRN
  Administered 2018-04-04: 25 ug via INTRAVENOUS

## 2018-04-04 MED ORDER — PROPOFOL 10 MG/ML IV BOLUS
INTRAVENOUS | Status: DC | PRN
  Administered 2018-04-04: 15 mg via INTRAVENOUS

## 2018-04-04 MED ORDER — FENTANYL CITRATE (PF) 100 MCG/2ML IJ SOLN: INTRAMUSCULAR | Status: DC | PRN

## 2018-04-04 MED ORDER — MIDAZOLAM HCL 5 MG/5ML IJ SOLN
INTRAMUSCULAR | Status: DC | PRN
  Administered 2018-04-04: 2 mg via INTRAVENOUS

## 2018-04-04 MED ORDER — LACTATED RINGERS IV SOLN
INTRAVENOUS | Status: DC
  Administered 2018-04-04: 10:00:00 via INTRAVENOUS

## 2018-04-04 MED ORDER — MIDAZOLAM HCL 2 MG/2ML IJ SOLN
INTRAMUSCULAR | Status: AC
  Filled 2018-04-04: qty 2

## 2018-04-04 NOTE — H&P (Signed)
Patient presents for electrical cardioversion for persistent afib. Please see recent referenced clinic note below for full history. She has been on xarelto at home. We will plan for cardioversion today with the assistance of anesthesiology  Carlyle Dolly MD     Clinical Summary [name] is a 68 y.o.female seen as new consult, referred by Dr Hilma Favors for afib  1.Persistent Afib - new diagnosis during recent pcp visit - no palpitations. Some fatigue, DOE for example unloading groceries. Some weight gain over the same time period.  - did not start xarelto as prescribed by pcp CHADS2Vasc (age, gender, HTN) is 3.  CrCl 55           Past Medical History:  Diagnosis Date  . Hypertension      No Known Allergies         Current Outpatient Medications  Medication Sig Dispense Refill  . hydrochlorothiazide (MICROZIDE) 12.5 MG capsule Take 12.5 mg by mouth daily.    Marland Kitchen losartan (COZAAR) 50 MG tablet Take 50 mg by mouth daily.    . Rivaroxaban (XARELTO PO) Take by mouth.     No current facility-administered medications for this visit.         No Known Allergies         Family History  Problem Relation Age of Onset  . Heart attack Father   . CAD Father   . Cancer Brother      Social History [name] has no tobacco history on file. [name] has no alcohol history on file.   Review of Systems CONSTITUTIONAL: No weight loss, fever, chills, weakness or fatigue.  HEENT: Eyes: No visual loss, blurred vision, double vision or yellow sclerae.No hearing loss, sneezing, congestion, runny nose or sore throat.  SKIN: No rash or itching.  CARDIOVASCULAR: per hpi RESPIRATORY: per hpi GASTROINTESTINAL: No anorexia, nausea, vomiting or diarrhea. No abdominal pain or blood.  GENITOURINARY: No burning on urination, no polyuria NEUROLOGICAL: No headache, dizziness, syncope, paralysis, ataxia, numbness or tingling in the extremities. No  change in bowel or bladder control.  MUSCULOSKELETAL: No muscle, back pain, joint pain or stiffness.  LYMPHATICS: No enlarged nodes. No history of splenectomy.  PSYCHIATRIC: No history of depression or anxiety.  ENDOCRINOLOGIC: No reports of sweating, cold or heat intolerance. No polyuria or polydipsia.  Marland Kitchen   Physical Examination     Vitals:   02/18/18 0902 02/18/18 0912  BP: (!) 144/94 (!) 146/90  Pulse: 94 92  SpO2: 98% 96%      Vitals:   02/18/18 0902  Weight: 172 lb (78 kg)  Height: 5' (1.524 m)    Gen: resting comfortably, no acute distress HEENT: no scleral icterus, pupils equal round and reactive, no palptable cervical adenopathy,  CV: irreg, no no m/r/g, no jvd Resp: Clear to auscultation bilaterally GI: abdomen is soft, non-tender, non-distended, normal bowel sounds, no hepatosplenomegaly MSK: extremities are warm, no edema.  Skin: warm, no rash Neuro:  no focal deficits Psych: appropriate affect     Assessment and Plan  1. Persistent afib - new diagnosis recently by pcp. EKG today shows she remains in afib with mildly elevated rate - no specific palpitatiions, but could be related to her DOE and fatigue - start lopressor 25mg  bid - CHADS2Vasc score is 3, start xarelto 20mg  daily.  - nursing visit 3 weeks for vitals and EKG, if still in afib would plan for DCCV - obtain echo   F/u 2 months. If still in afib at  nursing visit after 3 weeks of anticoag plan for DCCV     Carlyle Dolly MD

## 2018-04-04 NOTE — Anesthesia Postprocedure Evaluation (Signed)
Anesthesia Post Note  Patient: Stephanie Hale  Procedure(s) Performed: CARDIOVERSION (N/A )  Patient location during evaluation: PACU Anesthesia Type: MAC Level of consciousness: awake and patient cooperative Pain management: pain level controlled Vital Signs Assessment: post-procedure vital signs reviewed and stable Respiratory status: spontaneous breathing, nonlabored ventilation and respiratory function stable Cardiovascular status: blood pressure returned to baseline Postop Assessment: no apparent nausea or vomiting Anesthetic complications: no     Last Vitals:  Vitals:   04/04/18 0837 04/04/18 1000  BP: (!) 163/100 94/63  Pulse: 94 68  Resp: 18 17  Temp: 36.9 C   SpO2: 98% 100%    Last Pain:  Vitals:   04/04/18 1000  TempSrc:   PainSc: 0-No pain                 Damaris Abeln J

## 2018-04-04 NOTE — Transfer of Care (Signed)
Immediate Anesthesia Transfer of Care Note  Patient: Stephanie Hale  Procedure(s) Performed: CARDIOVERSION (N/A )  Patient Location: PACU  Anesthesia Type:MAC  Level of Consciousness: awake and patient cooperative  Airway & Oxygen Therapy: Patient Spontanous Breathing and Patient connected to face mask oxygen  Post-op Assessment: Report given to RN, Post -op Vital signs reviewed and stable and Patient moving all extremities  Post vital signs: Reviewed and stable  Last Vitals:  Vitals Value Taken Time  BP    Temp    Pulse    Resp    SpO2      Last Pain:  Vitals:   04/04/18 0837  TempSrc: Oral  PainSc: 0-No pain      Patients Stated Pain Goal: 4 (46/95/07 2257)  Complications: No apparent anesthesia complications

## 2018-04-04 NOTE — Anesthesia Preprocedure Evaluation (Signed)
Anesthesia Evaluation  Patient identified by MRN, date of birth, ID band Patient awake    Reviewed: Allergy & Precautions, H&P , NPO status , Patient's Chart, lab work & pertinent test results  Airway Mallampati: II  TM Distance: >3 FB Neck ROM: full    Dental no notable dental hx.    Pulmonary neg pulmonary ROS, former smoker,    Pulmonary exam normal breath sounds clear to auscultation       Cardiovascular Exercise Tolerance: Good hypertension, + dysrhythmias Atrial Fibrillation  Rhythm:regular Rate:Normal     Neuro/Psych PSYCHIATRIC DISORDERS Anxiety Depression negative neurological ROS     GI/Hepatic negative GI ROS, Neg liver ROS,   Endo/Other  negative endocrine ROS  Renal/GU negative Renal ROS  negative genitourinary   Musculoskeletal   Abdominal   Peds  Hematology negative hematology ROS (+)   Anesthesia Other Findings   Reproductive/Obstetrics negative OB ROS                             Anesthesia Physical Anesthesia Plan  ASA: III  Anesthesia Plan: MAC   Post-op Pain Management:    Induction:   PONV Risk Score and Plan:   Airway Management Planned:   Additional Equipment:   Intra-op Plan:   Post-operative Plan:   Informed Consent: I have reviewed the patients History and Physical, chart, labs and discussed the procedure including the risks, benefits and alternatives for the proposed anesthesia with the patient or authorized representative who has indicated his/her understanding and acceptance.     Dental Advisory Given  Plan Discussed with: CRNA  Anesthesia Plan Comments:         Anesthesia Quick Evaluation

## 2018-04-04 NOTE — CV Procedure (Signed)
Procedure: Electrical cardioversion Physician: Dr Carlyle Dolly MD Indication: Persistent atrial fibrillation   Patient was brought to the procedure suite after appropriate consent was obtained. Defib pads were placed in the anterior and posterior positions. Sedation achieved with the assistance of anesthesiology, please refer to there note for full details. She was succesfully cardioverted with a single synchronized 200j shock to sinus rhythm with PACs. Cardiopulmonary monitoring was performed throughout the procedure, she tolerated the procedure without complications.   Carlyle Dolly MD

## 2018-04-09 ENCOUNTER — Encounter (HOSPITAL_COMMUNITY): Payer: Self-pay | Admitting: Cardiology

## 2018-05-06 ENCOUNTER — Ambulatory Visit: Payer: Medicare Other | Admitting: Cardiology

## 2018-05-06 ENCOUNTER — Encounter: Payer: Self-pay | Admitting: Cardiology

## 2018-05-06 VITALS — BP 152/97 | HR 72 | Ht 60.0 in | Wt 177.4 lb

## 2018-05-06 DIAGNOSIS — I4891 Unspecified atrial fibrillation: Secondary | ICD-10-CM | POA: Diagnosis not present

## 2018-05-06 DIAGNOSIS — I10 Essential (primary) hypertension: Secondary | ICD-10-CM

## 2018-05-06 MED ORDER — METOPROLOL TARTRATE 25 MG PO TABS: 37.5000 mg | ORAL_TABLET | Freq: Two times a day (BID) | ORAL | 1 refills | Status: DC

## 2018-05-06 NOTE — Progress Notes (Signed)
Clinical Summary Stephanie Hale is a 68 y.o.female seen today for follow up of the following medical problems.   1.Persistent Afib CHADS2Vasc (age, gender, HTN) is 3 she is on xarelto CrCl 79     - s/p DCCV Jan 24,2020 for new diagnosis of persistent afib - no significant SOB, no palpitations. No changes in symptoms after cardioversion   Past Medical History:  Diagnosis Date  . Anxiety   . Atrial fibrillation ([place]) 12/2017  . Depression   . Dysrhythmia   . Hypertension      No Known Allergies   Current Outpatient Medications  Medication Sig Dispense Refill  . acetaminophen (TYLENOL) 325 MG tablet Take 650 mg by mouth every 6 (six) hours as needed for moderate pain.    . cetirizine (ZYRTEC) 10 MG tablet Take 10 mg by mouth daily.    . cholecalciferol (VITAMIN D3) 25 MCG (1000 UT) tablet Take 1,000 Units by mouth daily.    . diphenhydrAMINE (BENADRYL) 25 MG tablet Take 25 mg by mouth daily.     Marland Kitchen GLUCOSAMINE-CHONDROITIN PO Take 1,000 mg by mouth daily.    . Homeopathic Products [facility] ALLERGY EYE RELIEF OP) Place 2-3 drops into both eyes 3 (three) times daily as needed (allergies).    . hydrochlorothiazide (MICROZIDE) 12.5 MG capsule Take 12.5 mg by mouth daily.    Marland Kitchen losartan (COZAAR) 50 MG tablet Take 50 mg by mouth daily.    . Melatonin 10 MG CAPS Take 10 mg by mouth at bedtime.     . Menthol-Methyl Salicylate (TIGER BALM LINIMENT EX) Apply 1 application topically daily as needed (joint pain).    . metoprolol tartrate (LOPRESSOR) 25 MG tablet Take 1 tablet (25 mg total) by mouth 2 (two) times daily. 30 tablet 0  . Omega-3 Fatty Acids (FISH OIL) 1000 MG CAPS Take 1,000 mg by mouth daily.     . potassium chloride SA (K-DUR,KLOR-CON) 20 MEQ tablet Take 40 meq (2 tablets) daily for three days. 30 tablet 0  . rivaroxaban (XARELTO) 20 MG TABS tablet Take 1 tablet (20 mg total) by mouth daily with supper. 28 tablet 0   No current facility-administered medications for this  visit.      Past Surgical History:  Procedure Laterality Date  . CARDIOVERSION N/A 04/04/2018   Procedure: CARDIOVERSION;  Surgeon: Arnoldo Lenis, MD;  Location: [facility];  Service: Endoscopy;  Laterality: N/A;  . RETINAL DETACHMENT SURGERY  09/2010  . TONSILLECTOMY    . TUBAL LIGATION       No Known Allergies    Family History  Problem Relation Age of Onset  . Heart attack Father   . CAD Father   . Cancer Brother      Social History Ms. Lagunes reports that she quit smoking about 26 years ago. Her smoking use included cigarettes. She smoked 1.00 pack per day. She has never used smokeless tobacco. Ms. Melder reports previous alcohol use.   Review of Systems CONSTITUTIONAL: No weight loss, fever, chills, weakness or fatigue.  HEENT: Eyes: No visual loss, blurred vision, double vision or yellow sclerae.No hearing loss, sneezing, congestion, runny nose or sore throat.  SKIN: No rash or itching.  CARDIOVASCULAR: no chest pain, no palpitations.  RESPIRATORY: No shortness of breath, cough or sputum.  GASTROINTESTINAL: No anorexia, nausea, vomiting or diarrhea. No abdominal pain or blood.  GENITOURINARY: No burning on urination, no polyuria NEUROLOGICAL: No headache, dizziness, syncope, paralysis, ataxia, numbness or tingling in the extremities.  No change in bowel or bladder control.  MUSCULOSKELETAL: No muscle, back pain, joint pain or stiffness.  LYMPHATICS: No enlarged nodes. No history of splenectomy.  PSYCHIATRIC: No history of depression or anxiety.  ENDOCRINOLOGIC: No reports of sweating, cold or heat intolerance. No polyuria or polydipsia.  Marland Kitchen   Physical Examination Today's Vitals   05/06/18 1021  BP: (!) 152/97  Pulse: 72  SpO2: 96%  Weight: 177 lb 6.4 oz (80.5 kg)  Height: 5' (1.524 m)   Body mass index is 34.65 kg/m.  Gen: resting comfortably, no acute distress HEENT: no scleral icterus, pupils equal round and reactive, no palptable cervical  adenopathy,  CV: irreg, no m/r/g, no jvd Resp: Clear to auscultation bilaterally GI: abdomen is soft, non-tender, non-distended, normal bowel sounds, no hepatosplenomegaly MSK: extremities are warm, no edema.  Skin: warm, no rash Neuro:  no focal deficits Psych: appropriate affect   Diagnostic Studies 02/2018 echo Study Conclusions  - Left ventricle: The cavity size was normal. Wall thickness was   increased in a pattern of mild LVH. Systolic function was normal.   The estimated ejection fraction was in the range of 55% to 60%.   Wall motion was normal; there were no regional wall motion   abnormalities. The study was not technically sufficient to allow   evaluation of LV diastolic dysfunction due to atrial   fibrillation. - Mitral valve: There was mild regurgitation.    Assessment and Plan  1. Afib - s/p DCCV, today's ekg shows she is back in afib rates 90s. - no significant symptoms, no indication to attempt again to restore sinus rhythm - increase lopressor to 37.5mg  bid for better rate control. Continue xarelto   2. HTN - elevated today, manual repeat 138/85 - follow with higher lopressor dosing.    F/u 6 months   Arnoldo Lenis, M.D.

## 2018-05-06 NOTE — Patient Instructions (Signed)
Your physician wants you to follow-up in: [place] will receive a reminder letter in the mail two months in advance. If you don't receive a letter, please call our office to schedule the follow-up appointment.  Your physician has recommended you make the following change in your medication:   INCREASE LOPRESSOR 37.5 MG (1 AND 1/2 TABLETS) TWICE DAILY   Thank you for choosing [facility]!!

## 2018-05-08 ENCOUNTER — Other Ambulatory Visit (HOSPITAL_COMMUNITY): Payer: Self-pay | Admitting: Family Medicine

## 2018-05-08 DIAGNOSIS — Z1231 Encounter for screening mammogram for malignant neoplasm of breast: Secondary | ICD-10-CM

## 2018-05-21 ENCOUNTER — Other Ambulatory Visit: Payer: Self-pay | Admitting: *Deleted

## 2018-05-21 MED ORDER — RIVAROXABAN 20 MG PO TABS: 20.0000 mg | ORAL_TABLET | Freq: Every day | ORAL | 2 refills | Status: DC

## 2018-05-22 ENCOUNTER — Ambulatory Visit (HOSPITAL_COMMUNITY): Payer: Self-pay

## 2018-05-28 ENCOUNTER — Ambulatory Visit (HOSPITAL_COMMUNITY)
Admission: RE | Admit: 2018-05-28 | Discharge: 2018-05-28 | Disposition: A | Discharge status: Home / Self Care | Payer: Medicare Other | Source: Ambulatory Visit | Attending: Family Medicine | Admitting: Family Medicine

## 2018-05-28 ENCOUNTER — Encounter (HOSPITAL_COMMUNITY): Payer: Self-pay

## 2018-05-28 ENCOUNTER — Other Ambulatory Visit: Payer: Self-pay

## 2018-05-28 DIAGNOSIS — Z1231 Encounter for screening mammogram for malignant neoplasm of breast: Secondary | ICD-10-CM | POA: Insufficient documentation

## 2018-08-22 DIAGNOSIS — Z1389 Encounter for screening for other disorder: Secondary | ICD-10-CM | POA: Diagnosis not present

## 2018-08-22 DIAGNOSIS — N182 Chronic kidney disease, stage 2 (mild): Secondary | ICD-10-CM | POA: Diagnosis not present

## 2018-08-22 DIAGNOSIS — I10 Essential (primary) hypertension: Secondary | ICD-10-CM | POA: Diagnosis not present

## 2018-08-22 DIAGNOSIS — Z0001 Encounter for general adult medical examination with abnormal findings: Secondary | ICD-10-CM | POA: Diagnosis not present

## 2018-08-22 DIAGNOSIS — E7849 Other hyperlipidemia: Secondary | ICD-10-CM | POA: Diagnosis not present

## 2018-09-23 ENCOUNTER — Other Ambulatory Visit: Payer: Self-pay | Admitting: Cardiology

## 2018-11-06 DIAGNOSIS — Z85828 Personal history of other malignant neoplasm of skin: Secondary | ICD-10-CM | POA: Diagnosis not present

## 2018-11-06 DIAGNOSIS — C44519 Basal cell carcinoma of skin of other part of trunk: Secondary | ICD-10-CM | POA: Diagnosis not present

## 2018-11-06 DIAGNOSIS — Z08 Encounter for follow-up examination after completed treatment for malignant neoplasm: Secondary | ICD-10-CM | POA: Diagnosis not present

## 2018-11-06 DIAGNOSIS — C44219 Basal cell carcinoma of skin of left ear and external auricular canal: Secondary | ICD-10-CM | POA: Diagnosis not present

## 2018-11-10 ENCOUNTER — Telehealth: Payer: Self-pay | Admitting: *Deleted

## 2018-11-10 NOTE — Telephone Encounter (Signed)
The patient verbally consented for a telehealth phone visit with [facility] and understands that his/her insurance company will be billed for the encounter.  Will have vitals & medications.   

## 2018-11-13 ENCOUNTER — Encounter: Payer: Self-pay | Admitting: *Deleted

## 2018-11-13 ENCOUNTER — Telehealth (INDEPENDENT_AMBULATORY_CARE_PROVIDER_SITE_OTHER): Payer: Medicare Other | Admitting: Cardiology

## 2018-11-13 ENCOUNTER — Encounter: Payer: Self-pay | Admitting: Cardiology

## 2018-11-13 VITALS — BP 129/90 | HR 62 | Ht 60.0 in | Wt 170.0 lb

## 2018-11-13 DIAGNOSIS — I4891 Unspecified atrial fibrillation: Secondary | ICD-10-CM | POA: Diagnosis not present

## 2018-11-13 DIAGNOSIS — I10 Essential (primary) hypertension: Secondary | ICD-10-CM

## 2018-11-13 NOTE — Progress Notes (Signed)
Virtual Visit via Telephone Note   This visit type was conducted due to national recommendations for restrictions regarding the COVID-19 Pandemic (e.g. social distancing) in an effort to limit this patient's exposure and mitigate transmission in our community.  Due to her co-morbid illnesses, this patient is at least at moderate risk for complications without adequate follow up.  This format is felt to be most appropriate for this patient at this time.  The patient did not have access to video technology/had technical difficulties with video requiring transitioning to audio format only (telephone).  All issues noted in this document were discussed and addressed.  No physical exam could be performed with this format.  Please refer to the patient's chart for her  consent to telehealth for [facility].   Date:  11/13/2018   ID:  Stephanie Hale, DOB 02-10-1951, MRN [id number]  Patient Location: Home Provider Location: Office  PCP:  Jake Samples, PA-C  Cardiologist:  Carlyle Dolly, MD  Electrophysiologist:  None   Evaluation Performed:  Follow-Up Visit  Chief Complaint:  Follow up  History of Present Illness:    Stephanie Hale is a 68 y.o. female seen today for follow up of the following medical problems.   1.PersistentAfib CHADS2Vasc (age, gender, HTN) is 3 she is on xarelto CrCl 29   - s/p DCCV Jan 24,2020 for new diagnosis of persistent afib. Reverted back to afib shortly after - no recent palpitations - compliant with meds  2. HTN - compliant with meds    The patient does not have symptoms concerning for COVID-19 infection (fever, chills, cough, or new shortness of breath).    Past Medical History:  Diagnosis Date  . Anxiety   . Atrial fibrillation ([place]) 12/2017  . Depression   . Dysrhythmia   . Hypertension    Past Surgical History:  Procedure Laterality Date  . CARDIOVERSION N/A 04/04/2018   Procedure: CARDIOVERSION;  Surgeon: Arnoldo Lenis, MD;  Location: [facility];  Service: Endoscopy;  Laterality: N/A;  . RETINAL DETACHMENT SURGERY  09/2010  . TONSILLECTOMY    . TUBAL LIGATION       Current Meds  Medication Sig  . acetaminophen (TYLENOL) 325 MG tablet Take 650 mg by mouth every 6 (six) hours as needed for moderate pain.  . cetirizine (ZYRTEC) 10 MG tablet Take 10 mg by mouth daily.  . cholecalciferol (VITAMIN D3) 25 MCG (1000 UT) tablet Take 1,000 Units by mouth daily.  . diphenhydrAMINE (BENADRYL) 25 MG tablet Take 25 mg by mouth daily.   Marland Kitchen GLUCOSAMINE-CHONDROITIN PO Take 1,000 mg by mouth daily.  . Homeopathic Products [facility] ALLERGY EYE RELIEF OP) Place 2-3 drops into both eyes 3 (three) times daily as needed (allergies).  . hydrochlorothiazide (MICROZIDE) 12.5 MG capsule Take 12.5 mg by mouth daily.  Marland Kitchen losartan (COZAAR) 50 MG tablet Take 50 mg by mouth daily.  . Melatonin 10 MG CAPS Take 10 mg by mouth at bedtime.   . Menthol-Methyl Salicylate (TIGER BALM LINIMENT EX) Apply 1 application topically daily as needed (joint pain).  . metoprolol tartrate (LOPRESSOR) 25 MG tablet TAKE 1 AND 1/2 TABLETS BY  MOUTH TWO TIMES DAILY  . Omega-3 Fatty Acids (FISH OIL) 1000 MG CAPS Take 1,000 mg by mouth daily.   . rivaroxaban (XARELTO) 20 MG TABS tablet Take 1 tablet (20 mg total) by mouth daily with supper.  . [DISCONTINUED] potassium chloride SA (K-DUR,KLOR-CON) 20 MEQ tablet Take 40 meq (2 tablets) daily for  three days.     Allergies:   Patient has no known allergies.   Social History   Tobacco Use  . Smoking status: Former Smoker    Packs/day: 1.00    Types: Cigarettes    Quit date: 03/12/1992    Years since quitting: 26.6  . Smokeless tobacco: Never Used  Substance Use Topics  . Alcohol use: Not Currently    Frequency: Never    Comment: recovering alcoholic [id number]  . Drug use: Never     Family Hx: The patient's family history includes CAD in her father; Cancer in her brother; Heart attack in her father.  ROS:    Please see the history of present illness.     All other systems reviewed and are negative.   Prior CV studies:   The following studies were reviewed today:  02/2018 echo Study Conclusions  - Left ventricle: The cavity size was normal. Wall thickness was increased in a pattern of mild LVH. Systolic function was normal. The estimated ejection fraction was in the range of 55% to 60%. Wall motion was normal; there were no regional wall motion abnormalities. The study was not technically sufficient to allow evaluation of LV diastolic dysfunction due to atrial fibrillation. - Mitral valve: There was mild regurgitation.  Labs/Other Tests and Data Reviewed:    EKG:  No ECG reviewed.  Recent Labs: 03/31/2018: BUN 21; Creatinine, Ser 1.13; Potassium 3.3; Sodium 139   Recent Lipid Panel No results found for: CHOL, TRIG, HDL, CHOLHDL, LDLCALC, LDLDIRECT  Wt Readings from Last 3 Encounters:  11/13/18 170 lb (77.1 kg)  05/06/18 177 lb 6.4 oz (80.5 kg)  03/31/18 173 lb (78.5 kg)     Objective:    Vital Signs:  BP 129/90   Pulse 62   Ht 5' (1.524 m)   Wt 170 lb (77.1 kg)   BMI 33.20 kg/m    Normal affect. Normal speech pattern and tone. Comfortable, no apparent distress. No audible signs of SOB or wheezing.   ASSESSMENT & PLAN:    1. Afib - s/p DCCV, reverted back to afib. She is doing well with just rate control at this time.  - no recent symptoms, continue current meds including anticoag   2. HTN - essentially at goal, continue current meds    COVID-19 Education: The signs and symptoms of COVID-19 were discussed with the patient and how to seek care for testing (follow up with PCP or arrange E-visit).  The importance of social distancing was discussed today.  Time:   Today, I have spent 14 minutes with the patient with telehealth technology discussing the above problems.     Medication Adjustments/Labs and Tests Ordered: Current medicines are  reviewed at length with the patient today.  Concerns regarding medicines are outlined above.   Tests Ordered: No orders of the defined types were placed in this encounter.   Medication Changes: No orders of the defined types were placed in this encounter.   Follow Up:  In Person in 6 month(s)  Signed, Carlyle Dolly, MD  11/13/2018 3:36 PM    [place]

## 2018-11-13 NOTE — Patient Instructions (Signed)

## 2018-12-15 ENCOUNTER — Other Ambulatory Visit: Payer: Self-pay | Admitting: Cardiology

## 2018-12-22 DIAGNOSIS — Z08 Encounter for follow-up examination after completed treatment for malignant neoplasm: Secondary | ICD-10-CM | POA: Diagnosis not present

## 2018-12-22 DIAGNOSIS — Z85828 Personal history of other malignant neoplasm of skin: Secondary | ICD-10-CM | POA: Diagnosis not present

## 2018-12-22 DIAGNOSIS — L570 Actinic keratosis: Secondary | ICD-10-CM | POA: Diagnosis not present

## 2018-12-22 DIAGNOSIS — X32XXXD Exposure to sunlight, subsequent encounter: Secondary | ICD-10-CM | POA: Diagnosis not present

## 2019-04-05 ENCOUNTER — Other Ambulatory Visit: Payer: Self-pay | Admitting: Cardiology

## 2019-06-09 ENCOUNTER — Ambulatory Visit (INDEPENDENT_AMBULATORY_CARE_PROVIDER_SITE_OTHER): Payer: Medicare Other | Admitting: Cardiology

## 2019-06-09 ENCOUNTER — Other Ambulatory Visit: Payer: Self-pay

## 2019-06-09 ENCOUNTER — Encounter: Payer: Self-pay | Admitting: Cardiology

## 2019-06-09 VITALS — BP 138/88 | HR 77 | Ht 60.0 in | Wt 182.0 lb

## 2019-06-09 DIAGNOSIS — I4891 Unspecified atrial fibrillation: Secondary | ICD-10-CM | POA: Diagnosis not present

## 2019-06-09 DIAGNOSIS — I10 Essential (primary) hypertension: Secondary | ICD-10-CM

## 2019-06-09 MED ORDER — LOSARTAN POTASSIUM 100 MG PO TABS: 100.0000 mg | ORAL_TABLET | Freq: Every day | ORAL | 1 refills | Status: DC

## 2019-06-09 NOTE — Patient Instructions (Signed)
Your physician wants you to follow-up in: [place] will receive a reminder letter in the mail two months in advance. If you don't receive a letter, please call our office to schedule the follow-up appointment.  Your physician has recommended you make the following change in your medication:   INCREASE LOSARTAN 100 MG DAILY   Your physician recommends that you return for lab work in: 2 WEEKS BMP/MG/TSH/LIPIDS/HGBA1C/CBC  Thank you for choosing [facility]!!

## 2019-06-09 NOTE — Progress Notes (Signed)
Clinical Summary Stephanie Hale is a 69 y.o.female seen today for follow up of the following medical problems.  1.PersistentAfib CHADS2Vasc (age, gender, HTN) is 3she is on xarelto CrCl 52  - s/p DCCV Jan 24,2053for new diagnosis of persistent afib. Reverted back to afib shortly after. Undergoing rate control strategy   - no recent palpitations - no bleeding on xarelto   2. HTN - she is compliant with meds  SH: crochets as hobby for charities Past Medical History:  Diagnosis Date  . Anxiety   . Atrial fibrillation ([place]) 12/2017  . Depression   . Dysrhythmia   . Hypertension      No Known Allergies   Current Outpatient Medications  Medication Sig Dispense Refill  . acetaminophen (TYLENOL) 325 MG tablet Take 650 mg by mouth every 6 (six) hours as needed for moderate pain.    . cetirizine (ZYRTEC) 10 MG tablet Take 10 mg by mouth daily.    . cholecalciferol (VITAMIN D3) 25 MCG (1000 UT) tablet Take 1,000 Units by mouth daily.    . diphenhydrAMINE (BENADRYL) 25 MG tablet Take 25 mg by mouth daily.     Marland Kitchen GLUCOSAMINE-CHONDROITIN PO Take 1,000 mg by mouth daily.    . Homeopathic Products [facility] ALLERGY EYE RELIEF OP) Place 2-3 drops into both eyes 3 (three) times daily as needed (allergies).    . hydrochlorothiazide (MICROZIDE) 12.5 MG capsule Take 12.5 mg by mouth daily.    Marland Kitchen losartan (COZAAR) 50 MG tablet Take 50 mg by mouth daily.    . Melatonin 10 MG CAPS Take 10 mg by mouth at bedtime.     . Menthol-Methyl Salicylate (TIGER BALM LINIMENT EX) Apply 1 application topically daily as needed (joint pain).    . metoprolol tartrate (LOPRESSOR) 25 MG tablet TAKE 1 AND 1/2 TABLETS BY  MOUTH TWICE DAILY 270 tablet 3  . Omega-3 Fatty Acids (FISH OIL) 1000 MG CAPS Take 1,000 mg by mouth daily.     Alveda Reasons 20 MG TABS tablet TAKE 1 TABLET BY MOUTH  DAILY WITH SUPPER 90 tablet 3   No current facility-administered medications for this visit.     Past Surgical  History:  Procedure Laterality Date  . CARDIOVERSION N/A 04/04/2018   Procedure: CARDIOVERSION;  Surgeon: Arnoldo Lenis, MD;  Location: [facility];  Service: Endoscopy;  Laterality: N/A;  . RETINAL DETACHMENT SURGERY  09/2010  . TONSILLECTOMY    . TUBAL LIGATION       No Known Allergies    Family History  Problem Relation Age of Onset  . Heart attack Father   . CAD Father   . Cancer Brother      Social History Stephanie Hale reports that she quit smoking about 27 years ago. Her smoking use included cigarettes. She smoked 1.00 pack per day. She has never used smokeless tobacco. Stephanie Hale reports previous alcohol use.   Review of Systems CONSTITUTIONAL: No weight loss, fever, chills, weakness or fatigue.  HEENT: Eyes: No visual loss, blurred vision, double vision or yellow sclerae.No hearing loss, sneezing, congestion, runny nose or sore throat.  SKIN: No rash or itching.  CARDIOVASCULAR: per hpi RESPIRATORY: No shortness of breath, cough or sputum.  GASTROINTESTINAL: No anorexia, nausea, vomiting or diarrhea. No abdominal pain or blood.  GENITOURINARY: No burning on urination, no polyuria NEUROLOGICAL: No headache, dizziness, syncope, paralysis, ataxia, numbness or tingling in the extremities. No change in bowel or bladder control.  MUSCULOSKELETAL: No muscle, back pain, joint  pain or stiffness.  LYMPHATICS: No enlarged nodes. No history of splenectomy.  PSYCHIATRIC: No history of depression or anxiety.  ENDOCRINOLOGIC: No reports of sweating, cold or heat intolerance. No polyuria or polydipsia.  Marland Kitchen   Physical Examination Today's Vitals   06/09/19 1558  BP: 138/88  Pulse: 77  SpO2: 98%  Weight: 182 lb (82.6 kg)  Height: 5' (1.524 m)   Body mass index is 35.54 kg/m.  Gen: resting comfortably, no acute distress HEENT: no scleral icterus, pupils equal round and reactive, no palptable cervical adenopathy,  CV: irreg, no m/r/g, no jvd Resp: Clear to auscultation  bilaterally GI: abdomen is soft, non-tender, non-distended, normal bowel sounds, no hepatosplenomegaly MSK: extremities are warm, no edema.  Skin: warm, no rash Neuro:  no focal deficits Psych: appropriate affect   Diagnostic Studies  02/2018 echo Study Conclusions  - Left ventricle: The cavity size was normal. Wall thickness was increased in a pattern of mild LVH. Systolic function was normal. The estimated ejection fraction was in the range of 55% to 60%. Wall motion was normal; there were no regional wall motion abnormalities. The study was not technically sufficient to allow evaluation of LV diastolic dysfunction due to atrial fibrillation. - Mitral valve: There was mild regurgitation.   Assessment and Plan  1.Afib - s/p DCCV, reverted back to afib. Has done well with just rate control - ekg today shows rate controlled afib - continue current meds, no symptoms   2. HTN - above goal, increase losartan to 100mg  daily - checklabs in 2 weeks   F/u 6 months   Arnoldo Lenis, M.D.

## 2019-08-07 DIAGNOSIS — E789 Disorder of lipoprotein metabolism, unspecified: Secondary | ICD-10-CM | POA: Diagnosis not present

## 2019-08-07 DIAGNOSIS — E7849 Other hyperlipidemia: Secondary | ICD-10-CM | POA: Diagnosis not present

## 2019-08-07 DIAGNOSIS — I4891 Unspecified atrial fibrillation: Secondary | ICD-10-CM | POA: Diagnosis not present

## 2019-08-07 DIAGNOSIS — R7309 Other abnormal glucose: Secondary | ICD-10-CM | POA: Diagnosis not present

## 2019-08-21 ENCOUNTER — Telehealth: Payer: Self-pay | Admitting: *Deleted

## 2019-08-21 NOTE — Telephone Encounter (Signed)
Pt aware - routed to pcp  

## 2019-08-21 NOTE — Telephone Encounter (Signed)
-----   Message from Arnoldo Lenis, MD sent at 08/19/2019  1:04 PM EDT ----- Labs look fine   Zandra Abts MD

## 2019-08-26 ENCOUNTER — Other Ambulatory Visit (HOSPITAL_COMMUNITY): Payer: Self-pay | Admitting: Family Medicine

## 2019-08-26 DIAGNOSIS — Z1231 Encounter for screening mammogram for malignant neoplasm of breast: Secondary | ICD-10-CM

## 2019-09-03 DIAGNOSIS — Z0000 Encounter for general adult medical examination without abnormal findings: Secondary | ICD-10-CM | POA: Diagnosis not present

## 2019-09-03 DIAGNOSIS — N182 Chronic kidney disease, stage 2 (mild): Secondary | ICD-10-CM | POA: Diagnosis not present

## 2019-09-03 DIAGNOSIS — Z1389 Encounter for screening for other disorder: Secondary | ICD-10-CM | POA: Diagnosis not present

## 2019-09-03 DIAGNOSIS — E7849 Other hyperlipidemia: Secondary | ICD-10-CM | POA: Diagnosis not present

## 2019-09-03 DIAGNOSIS — I10 Essential (primary) hypertension: Secondary | ICD-10-CM | POA: Diagnosis not present

## 2019-09-09 ENCOUNTER — Ambulatory Visit (HOSPITAL_COMMUNITY)
Admission: RE | Admit: 2019-09-09 | Discharge: 2019-09-09 | Disposition: A | Discharge status: Home / Self Care | Payer: Medicare Other | Source: Ambulatory Visit | Attending: Family Medicine | Admitting: Family Medicine

## 2019-09-09 ENCOUNTER — Other Ambulatory Visit: Payer: Self-pay

## 2019-09-09 DIAGNOSIS — Z1231 Encounter for screening mammogram for malignant neoplasm of breast: Secondary | ICD-10-CM | POA: Insufficient documentation

## 2019-09-12 IMAGING — MG DIGITAL SCREENING BILATERAL MAMMOGRAM WITH TOMO AND CAD
7 series · 8 of 23 positions shown · non-contrast
Comparison: Previous exam(s).

CLINICAL DATA: Screening.

EXAM:
DIGITAL SCREENING BILATERAL MAMMOGRAM WITH TOMO AND CAD

[L MLO synth-2D]
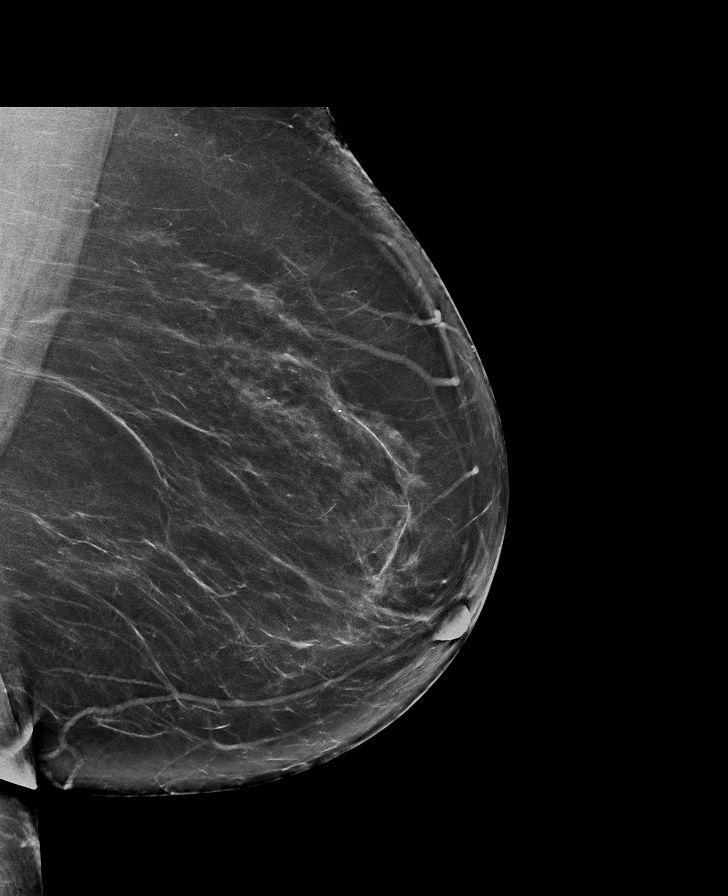

[R CC synth-2D]
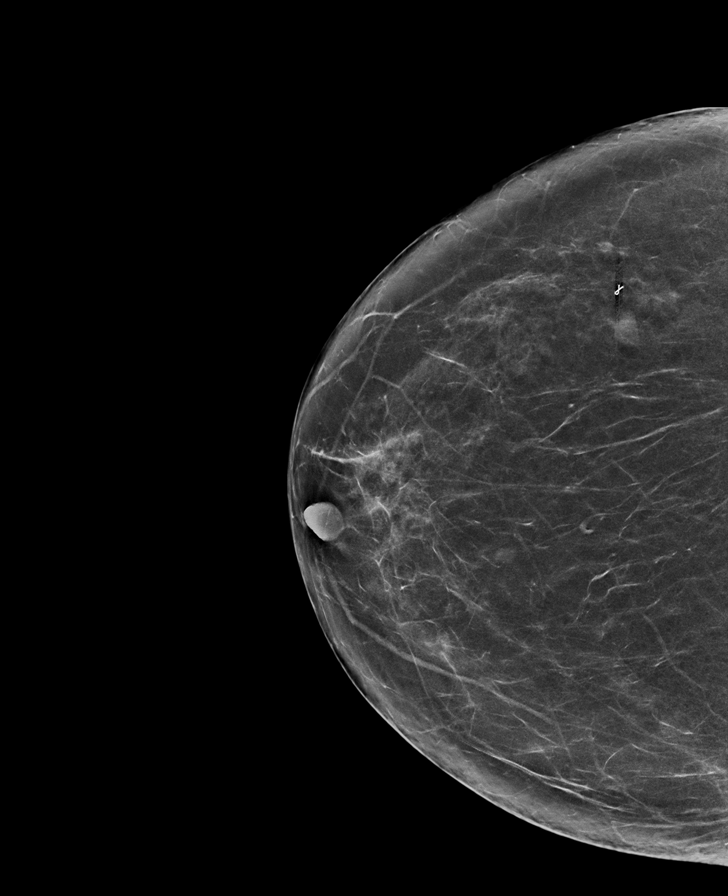

[L CC synth-2D]
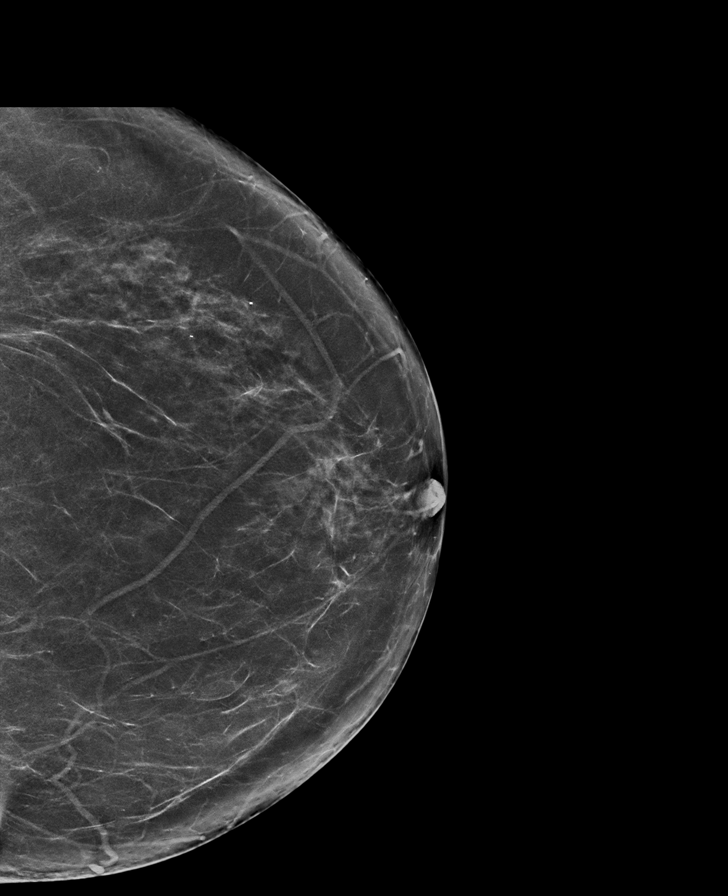

[R CC tomo · 2 of 72 frames shown]
[frame 24/72]
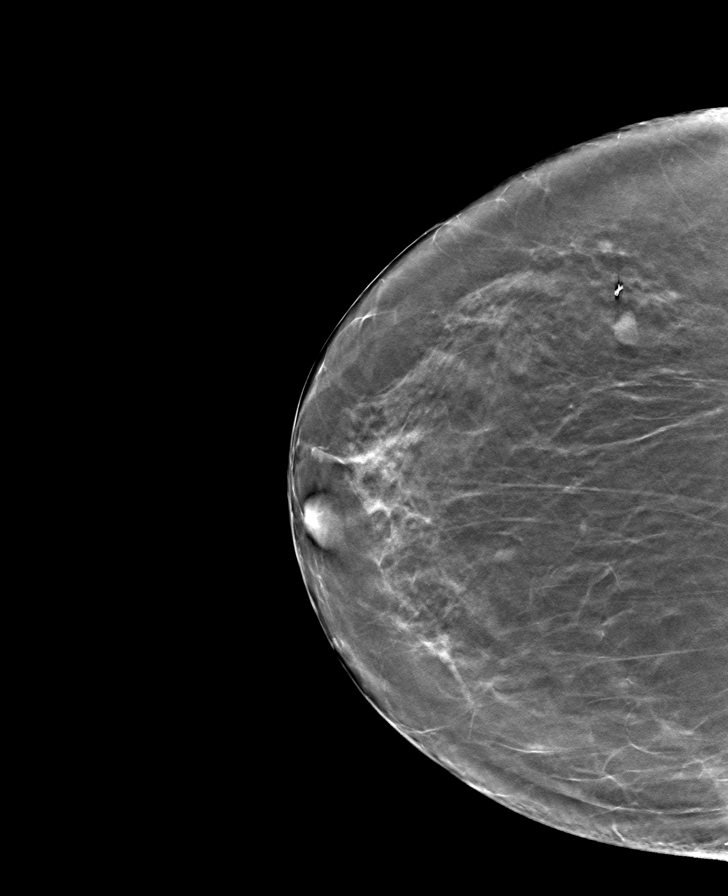
[frame 37/72]
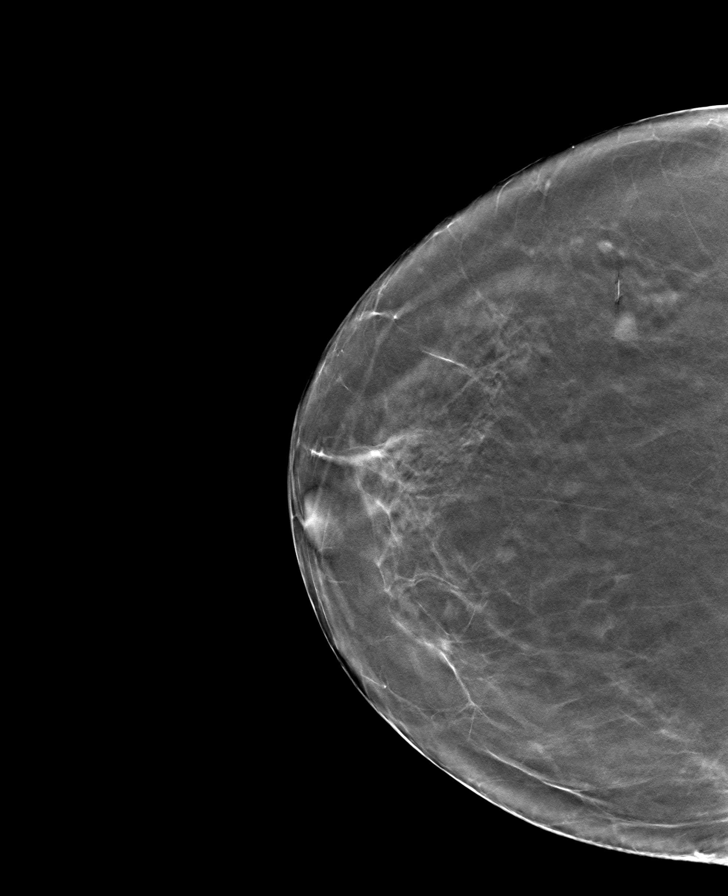

[L MLO tomo · tomo slice 43/85.0]
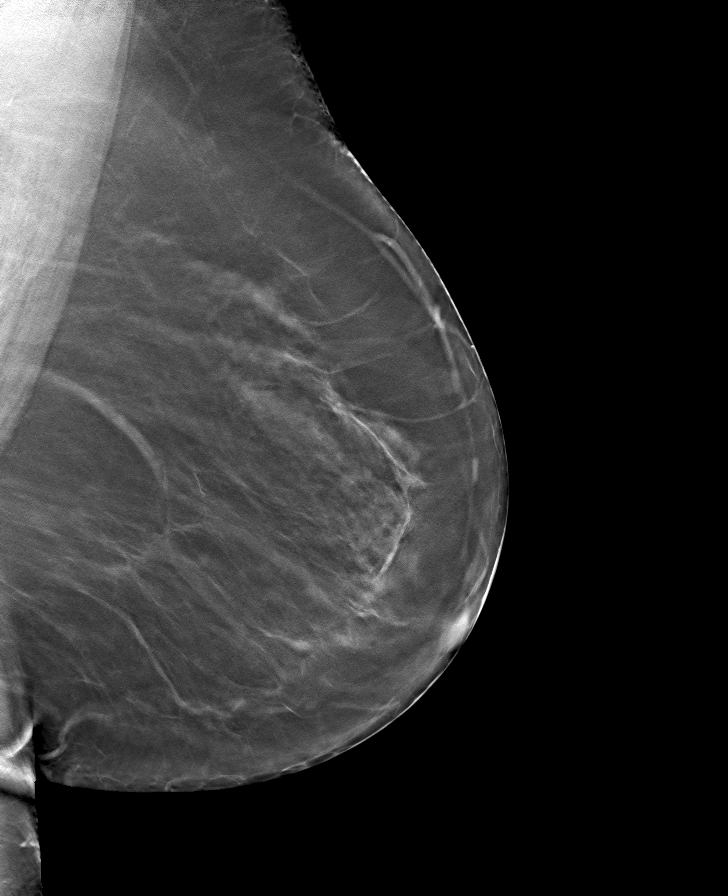

[R MLO tomo · tomo slice 41/81.0]
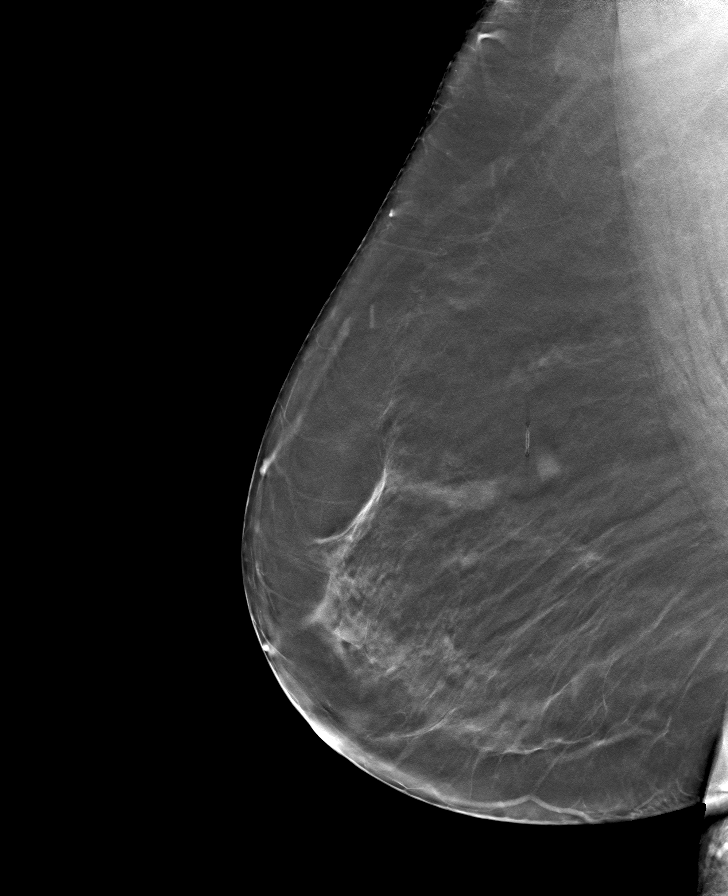

[L CC tomo · tomo slice 39/76.0]
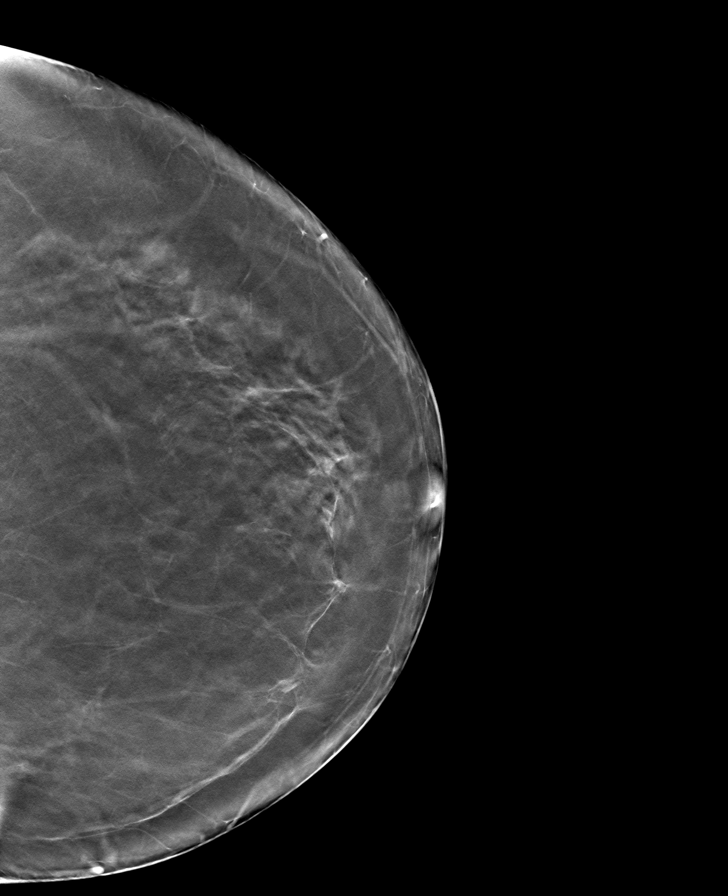

[8 of 23 positions shown; findings below may reference images not displayed]

ACR Breast Density Category b: There are scattered areas of
fibroglandular density.
FINDINGS: There are no findings suspicious for malignancy. Images were
processed with CAD.
IMPRESSION: No mammographic evidence of malignancy. A result letter of this
screening mammogram will be mailed directly to the patient.

RECOMMENDATION:
Screening mammogram in one year. (Code:[id number])

BI-RADS CATEGORY  1: Negative.

## 2019-10-21 DIAGNOSIS — H18832 Recurrent erosion of cornea, left eye: Secondary | ICD-10-CM | POA: Diagnosis not present

## 2019-10-21 DIAGNOSIS — H524 Presbyopia: Secondary | ICD-10-CM | POA: Diagnosis not present

## 2019-10-21 DIAGNOSIS — H26493 Other secondary cataract, bilateral: Secondary | ICD-10-CM | POA: Diagnosis not present

## 2019-10-21 DIAGNOSIS — H33021 Retinal detachment with multiple breaks, right eye: Secondary | ICD-10-CM | POA: Diagnosis not present

## 2019-10-21 DIAGNOSIS — Z961 Presence of intraocular lens: Secondary | ICD-10-CM | POA: Diagnosis not present

## 2019-11-06 ENCOUNTER — Other Ambulatory Visit: Payer: Self-pay | Admitting: Cardiology

## 2019-12-24 DIAGNOSIS — Z961 Presence of intraocular lens: Secondary | ICD-10-CM | POA: Diagnosis not present

## 2019-12-24 DIAGNOSIS — H01001 Unspecified blepharitis right upper eyelid: Secondary | ICD-10-CM | POA: Diagnosis not present

## 2019-12-24 DIAGNOSIS — H26493 Other secondary cataract, bilateral: Secondary | ICD-10-CM | POA: Diagnosis not present

## 2019-12-24 DIAGNOSIS — H01002 Unspecified blepharitis right lower eyelid: Secondary | ICD-10-CM | POA: Diagnosis not present

## 2019-12-25 ENCOUNTER — Other Ambulatory Visit: Payer: Self-pay | Admitting: Cardiology

## 2019-12-29 ENCOUNTER — Other Ambulatory Visit: Payer: Self-pay | Admitting: Cardiology

## 2020-03-26 ENCOUNTER — Other Ambulatory Visit: Payer: Self-pay | Admitting: Cardiology

## 2020-04-22 ENCOUNTER — Encounter: Payer: Self-pay | Admitting: *Deleted

## 2020-04-22 ENCOUNTER — Encounter: Payer: Self-pay | Admitting: Cardiology

## 2020-04-22 ENCOUNTER — Ambulatory Visit: Payer: Medicare Other | Admitting: Cardiology

## 2020-04-22 VITALS — BP 138/90 | HR 78 | Ht 60.0 in | Wt 179.0 lb

## 2020-04-22 DIAGNOSIS — I10 Essential (primary) hypertension: Secondary | ICD-10-CM | POA: Diagnosis not present

## 2020-04-22 DIAGNOSIS — I4891 Unspecified atrial fibrillation: Secondary | ICD-10-CM

## 2020-04-22 NOTE — Progress Notes (Signed)
Clinical Summary Ms. Zavadil is a 70 y.o.female seen today for follow up of the following medical problems.  1.PersistentAfib CHADS2Vasc (age, gender, HTN) is 3she is on xarelto CrCl 23  - s/p DCCV Jan 24,2064for new diagnosis of persistent afib. Reverted back to afib shortly after. Undergoing rate control strategy   - no recent palpitations - no bleeding on xarelto, no signs of bleeding.       2. HTN - remains compliant with meds    SH: crochets as hobby for charities Has several animals at home including cats, dogs, birds  Past Medical History:  Diagnosis Date  . Anxiety   . Atrial fibrillation ([place]) 12/2017  . Depression   . Dysrhythmia   . Hypertension      No Known Allergies   Current Outpatient Medications  Medication Sig Dispense Refill  . acetaminophen (TYLENOL) 325 MG tablet Take 650 mg by mouth every 6 (six) hours as needed for moderate pain.    . cetirizine (ZYRTEC) 10 MG tablet Take 10 mg by mouth daily.    . cholecalciferol (VITAMIN D3) 25 MCG (1000 UT) tablet Take 1,000 Units by mouth daily.    . diphenhydrAMINE (BENADRYL) 25 MG tablet Take 25 mg by mouth daily.     Marland Kitchen GLUCOSAMINE-CHONDROITIN PO Take 1,000 mg by mouth daily.    . Homeopathic Products [facility] ALLERGY EYE RELIEF OP) Place 2-3 drops into both eyes 3 (three) times daily as needed (allergies).    . hydrochlorothiazide (MICROZIDE) 12.5 MG capsule Take 12.5 mg by mouth daily.    Marland Kitchen losartan (COZAAR) 100 MG tablet TAKE 1 TABLET BY MOUTH  DAILY 90 tablet 2  . Melatonin 10 MG CAPS Take 10 mg by mouth at bedtime.     . Menthol-Methyl Salicylate (TIGER BALM LINIMENT EX) Apply 1 application topically daily as needed (joint pain).    . metoprolol tartrate (LOPRESSOR) 25 MG tablet TAKE 1 AND 1/2 TABLETS BY  MOUTH TWICE DAILY 60 tablet 0  . Omega-3 Fatty Acids (FISH OIL) 1000 MG CAPS Take 1,000 mg by mouth daily.     Alveda Reasons 20 MG TABS tablet TAKE 1 TABLET BY MOUTH  DAILY  WITH SUPPER 90 tablet 1   No current facility-administered medications for this visit.     Past Surgical History:  Procedure Laterality Date  . CARDIOVERSION N/A 04/04/2018   Procedure: CARDIOVERSION;  Surgeon: Arnoldo Lenis, MD;  Location: [facility];  Service: Endoscopy;  Laterality: N/A;  . RETINAL DETACHMENT SURGERY  09/2010  . TONSILLECTOMY    . TUBAL LIGATION       No Known Allergies    Family History  Problem Relation Age of Onset  . Heart attack Father   . CAD Father   . Cancer Brother      Social History Ms. Heier reports that she quit smoking about 28 years ago. Her smoking use included cigarettes. She smoked 1.00 pack per day. She has never used smokeless tobacco. Ms. Whinery reports previous alcohol use.   Review of Systems CONSTITUTIONAL: No weight loss, fever, chills, weakness or fatigue.  HEENT: Eyes: No visual loss, blurred vision, double vision or yellow sclerae.No hearing loss, sneezing, congestion, runny nose or sore throat.  SKIN: No rash or itching.  CARDIOVASCULAR: per hpi RESPIRATORY: No shortness of breath, cough or sputum.  GASTROINTESTINAL: No anorexia, nausea, vomiting or diarrhea. No abdominal pain or blood.  GENITOURINARY: No burning on urination, no polyuria NEUROLOGICAL: No headache, dizziness, syncope, paralysis,  ataxia, numbness or tingling in the extremities. No change in bowel or bladder control.  MUSCULOSKELETAL: No muscle, back pain, joint pain or stiffness.  LYMPHATICS: No enlarged nodes. No history of splenectomy.  PSYCHIATRIC: No history of depression or anxiety.  ENDOCRINOLOGIC: No reports of sweating, cold or heat intolerance. No polyuria or polydipsia.  Marland Kitchen   Physical Examination Today's Vitals   04/22/20 1112  BP: 138/90  Pulse: 78  SpO2: 98%  Weight: 179 lb (81.2 kg)  Height: 5' (1.524 m)   Body mass index is 34.96 kg/m.  Gen: resting comfortably, no acute distress HEENT: no scleral icterus, pupils equal round  and reactive, no palptable cervical adenopathy,  CV: irreg, no m/r/v no jvd Resp: Clear to auscultation bilaterally GI: abdomen is soft, non-tender, non-distended, normal bowel sounds, no hepatosplenomegaly MSK: extremities are warm, no edema.  Skin: warm, no rash Neuro:  no focal deficits Psych: appropriate affect   Diagnostic Studies 02/2018 echo Study Conclusions  - Left ventricle: The cavity size was normal. Wall thickness was increased in a pattern of mild LVH. Systolic function was normal. The estimated ejection fraction was in the range of 55% to 60%. Wall motion was normal; there were no regional wall motion abnormalities. The study was not technically sufficient to allow evaluation of LV diastolic dysfunction due to atrial fibrillation. - Mitral valve: There was mild regurgitation.    Assessment and Plan  1.Afib - s/p DCCV,reverted back to afib. Has done well with just rate control - no symptoms, continue current meds including anticoag - EKG today shows rate controlled afib   2. HTN -bp manual recheck 132/84, essentially right at goal - discussed further lifestyle modifications, will continue current meds at this time.      Arnoldo Lenis, M.D.

## 2020-04-22 NOTE — Patient Instructions (Signed)

## 2020-04-25 ENCOUNTER — Other Ambulatory Visit: Payer: Self-pay | Admitting: Cardiology

## 2020-09-12 ENCOUNTER — Other Ambulatory Visit: Payer: Self-pay | Admitting: Cardiology

## 2020-09-23 ENCOUNTER — Other Ambulatory Visit (HOSPITAL_COMMUNITY): Payer: Self-pay | Admitting: Family Medicine

## 2020-09-23 DIAGNOSIS — Z1231 Encounter for screening mammogram for malignant neoplasm of breast: Secondary | ICD-10-CM

## 2020-09-28 ENCOUNTER — Other Ambulatory Visit: Payer: Self-pay

## 2020-09-28 ENCOUNTER — Ambulatory Visit (HOSPITAL_COMMUNITY)
Admission: RE | Admit: 2020-09-28 | Discharge: 2020-09-28 | Disposition: A | Discharge status: Home / Self Care | Payer: Medicare Other | Source: Ambulatory Visit | Attending: Family Medicine | Admitting: Family Medicine

## 2020-09-28 DIAGNOSIS — Z1231 Encounter for screening mammogram for malignant neoplasm of breast: Secondary | ICD-10-CM | POA: Diagnosis not present

## 2020-10-14 DIAGNOSIS — E7849 Other hyperlipidemia: Secondary | ICD-10-CM | POA: Diagnosis not present

## 2020-10-14 DIAGNOSIS — N182 Chronic kidney disease, stage 2 (mild): Secondary | ICD-10-CM | POA: Diagnosis not present

## 2020-10-14 DIAGNOSIS — I10 Essential (primary) hypertension: Secondary | ICD-10-CM | POA: Diagnosis not present

## 2020-10-14 DIAGNOSIS — Z1389 Encounter for screening for other disorder: Secondary | ICD-10-CM | POA: Diagnosis not present

## 2020-10-14 DIAGNOSIS — E782 Mixed hyperlipidemia: Secondary | ICD-10-CM | POA: Diagnosis not present

## 2020-10-14 DIAGNOSIS — Z23 Encounter for immunization: Secondary | ICD-10-CM | POA: Diagnosis not present

## 2020-10-14 DIAGNOSIS — Z0001 Encounter for general adult medical examination with abnormal findings: Secondary | ICD-10-CM | POA: Diagnosis not present

## 2020-11-03 ENCOUNTER — Other Ambulatory Visit: Payer: Self-pay | Admitting: Cardiology

## 2021-01-27 ENCOUNTER — Other Ambulatory Visit: Payer: Self-pay | Admitting: Cardiology

## 2021-01-27 NOTE — Telephone Encounter (Signed)
Prescription refill request for Xarelto received.  Indication: afib  Last office visit: [place], 04/22/2020 Weight: 81.2 kg  Age: 70 yo  Scr:  CrCl:   Per [facility] pt had labs done in August. Called PCP office to get blood work faxed over.

## 2021-01-30 NOTE — Telephone Encounter (Signed)
Called and spoke to [place] from from pt's PCP office. Verified that pt had a Scr of 1.20- 10/2020. Lattie Haw stated that she would fax over a copy of the blood work to be scanned into pt's chart.   CrCl: 54 ml/min   Refill sent.

## 2021-05-30 ENCOUNTER — Ambulatory Visit: Payer: Medicare Other | Admitting: Cardiology

## 2021-05-30 ENCOUNTER — Encounter: Payer: Self-pay | Admitting: Cardiology

## 2021-05-30 VITALS — BP 130/78 | HR 76 | Ht 60.0 in | Wt 182.0 lb

## 2021-05-30 DIAGNOSIS — I10 Essential (primary) hypertension: Secondary | ICD-10-CM

## 2021-05-30 DIAGNOSIS — D6869 Other thrombophilia: Secondary | ICD-10-CM | POA: Diagnosis not present

## 2021-05-30 DIAGNOSIS — I4891 Unspecified atrial fibrillation: Secondary | ICD-10-CM

## 2021-05-30 NOTE — Patient Instructions (Addendum)

## 2021-05-30 NOTE — Progress Notes (Signed)
? ? ? ?Clinical Summary ?Stephanie Hale is a 71 y.o.female seen today for follow up of the following medical problems.  ?  ?1.Persistent Afib ?CHADS2Vasc (age, gender, HTN) is 3 she is on xarelto ?CrCl 55   ?  ? - s/p DCCV Jan 24,2020 for new diagnosis of persistent afib. Reverted back to afib shortly after. Undergoing rate control strategy ?  ? ?  ?- no recent palpitations ?- compliant with meds. No bleeding on xarelto.  ?- CrCl 55 today, she is on xarelto '20mg'$    ?  ?  ?2. HTN ?- remains compliant with meds ?  ?  ?  ?SH: crochets as hobby for charities ?Has several animals at home including cats, dogs, birds ?Has son lives outside [place].  ? ? ? ?Past Medical History:  ?Diagnosis Date  ? Anxiety   ? Atrial fibrillation ([place]) 12/2017  ? Depression   ? Dysrhythmia   ? Hypertension   ? ? ? ?No Known Allergies ? ? ?Current Outpatient Medications  ?Medication Sig Dispense Refill  ? acetaminophen (TYLENOL) 325 MG tablet Take 650 mg by mouth every 6 (six) hours as needed for moderate pain.    ? cholecalciferol (VITAMIN D3) 25 MCG (1000 UT) tablet Take 1,000 Units by mouth daily.    ? diphenhydrAMINE (BENADRYL) 25 MG tablet Take 25 mg by mouth daily.     ? Homeopathic Products [facility] ALLERGY EYE RELIEF OP) Place 2-3 drops into both eyes 3 (three) times daily as needed (allergies).    ? hydrochlorothiazide (MICROZIDE) 12.5 MG capsule Take 12.5 mg by mouth daily.    ? losartan (COZAAR) 100 MG tablet TAKE 1 TABLET BY MOUTH  DAILY 90 tablet 3  ? Melatonin 10 MG CAPS Take 10 mg by mouth at bedtime.     ? Menthol-Methyl Salicylate (TIGER BALM LINIMENT EX) Apply 1 application topically daily as needed (joint pain).    ? metoprolol tartrate (LOPRESSOR) 25 MG tablet TAKE 1 AND 1/2 TABLETS BY  MOUTH TWICE DAILY 270 tablet 3  ? Omega-3 Fatty Acids (FISH OIL) 1000 MG CAPS Take 1,000 mg by mouth daily.     ? rivaroxaban (XARELTO) 20 MG TABS tablet TAKE 1 TABLET BY MOUTH  DAILY WITH SUPPER 90 tablet 1  ? ?No current  facility-administered medications for this visit.  ? ? ? ?Past Surgical History:  ?Procedure Laterality Date  ? CARDIOVERSION N/A 04/04/2018  ? Procedure: CARDIOVERSION;  Surgeon: Arnoldo Lenis, MD;  Location: [facility];  Service: Endoscopy;  Laterality: N/A;  ? RETINAL DETACHMENT SURGERY  09/2010  ? TONSILLECTOMY    ? TUBAL LIGATION    ? ? ? ?No Known Allergies ? ? ? ?Family History  ?Problem Relation Age of Onset  ? Heart attack Father   ? CAD Father   ? Cancer Brother   ? ? ? ?Social History ?Stephanie Hale reports that she quit smoking about 29 years ago. Her smoking use included cigarettes. She smoked an average of 1 pack per day. She has never used smokeless tobacco. ?Stephanie Hale reports that she does not currently use alcohol. ? ? ?Review of Systems ?CONSTITUTIONAL: No weight loss, fever, chills, weakness or fatigue.  ?HEENT: Eyes: No visual loss, blurred vision, double vision or yellow sclerae.No hearing loss, sneezing, congestion, runny nose or sore throat.  ?SKIN: No rash or itching.  ?CARDIOVASCULAR: per hpi ?RESPIRATORY: No shortness of breath, cough or sputum.  ?GASTROINTESTINAL: No anorexia, nausea, vomiting or diarrhea. No abdominal pain or blood.  ?  GENITOURINARY: No burning on urination, no polyuria ?NEUROLOGICAL: No headache, dizziness, syncope, paralysis, ataxia, numbness or tingling in the extremities. No change in bowel or bladder control.  ?MUSCULOSKELETAL: No muscle, back pain, joint pain or stiffness.  ?LYMPHATICS: No enlarged nodes. No history of splenectomy.  ?PSYCHIATRIC: No history of depression or anxiety.  ?ENDOCRINOLOGIC: No reports of sweating, cold or heat intolerance. No polyuria or polydipsia.  ?. ? ? ?Physical Examination ?Today's Vitals  ? 05/30/21 1116  ?BP: (!) 144/90  ?Pulse: 76  ?SpO2: 96%  ?Weight: 182 lb (82.6 kg)  ?Height: 5' (1.524 m)  ? ?Body mass index is 35.54 kg/m?. ? ?Gen: resting comfortably, no acute distress ?HEENT: no scleral icterus, pupils equal round and  reactive, no palptable cervical adenopathy,  ?CV: irreg, no m/r/g no jvd ?Resp: Clear to auscultation bilaterally ?GI: abdomen is soft, non-tender, non-distended, normal bowel sounds, no hepatosplenomegaly ?MSK: extremities are warm, no edema.  ?Skin: warm, no rash ?Neuro:  no focal deficits ?Psych: appropriate affect ? ? ?Diagnostic Studies ? ?02/2018 echo ?Study Conclusions ?  ?- Left ventricle: The cavity size was normal. Wall thickness was ?  increased in a pattern of mild LVH. Systolic function was normal. ?  The estimated ejection fraction was in the range of 55% to 60%. ?  Wall motion was normal; there were no regional wall motion ?  abnormalities. The study was not technically sufficient to allow ?  evaluation of LV diastolic dysfunction due to atrial ?  fibrillation. ?- Mitral valve: There was mild regurgitation. ? ? ?Assessment and Plan  ? ?1. Afib/acquired thrombophilia ?- s/p DCCV, reverted back to afib. Has done well with just rate control ?-no recent symptoms, EKG today shows rate controll afib ?- continue current meds including xarelto for stroke prevention ?  ?  ?2. HTN ?- at goal, continue current meds ? ? ?F/u 1 year ? ? ? ?Arnoldo Lenis, M.D. ?

## 2021-07-07 ENCOUNTER — Other Ambulatory Visit: Payer: Self-pay | Admitting: Cardiology

## 2021-07-07 NOTE — Telephone Encounter (Signed)
Prescription refill request for Xarelto received.  ?Indication: Atrial Fib ?Last office visit: 05/30/21  Zandra Abts MD ?Weight: 82.6kg ?Age: 71 ?Scr: 1.20 on 10/14/20 ?CrCl: 55.26 ? ?Based on above findings Xarelto '20mg'$  daily is the appropriate dose.  Refill approved. ?

## 2021-09-25 ENCOUNTER — Other Ambulatory Visit (HOSPITAL_COMMUNITY): Payer: Self-pay | Admitting: Family Medicine

## 2021-09-25 DIAGNOSIS — Z1231 Encounter for screening mammogram for malignant neoplasm of breast: Secondary | ICD-10-CM

## 2021-10-04 ENCOUNTER — Ambulatory Visit (HOSPITAL_COMMUNITY)
Admission: RE | Admit: 2021-10-04 | Discharge: 2021-10-04 | Disposition: A | Discharge status: Home / Self Care | Payer: Medicare Other | Source: Ambulatory Visit | Attending: Family Medicine | Admitting: Family Medicine

## 2021-10-04 DIAGNOSIS — Z1231 Encounter for screening mammogram for malignant neoplasm of breast: Secondary | ICD-10-CM | POA: Diagnosis not present

## 2021-10-12 DIAGNOSIS — C4441 Basal cell carcinoma of skin of scalp and neck: Secondary | ICD-10-CM | POA: Diagnosis not present

## 2021-10-12 DIAGNOSIS — C44319 Basal cell carcinoma of skin of other parts of face: Secondary | ICD-10-CM | POA: Diagnosis not present

## 2021-10-12 DIAGNOSIS — C4401 Basal cell carcinoma of skin of lip: Secondary | ICD-10-CM | POA: Diagnosis not present

## 2021-10-12 DIAGNOSIS — L218 Other seborrheic dermatitis: Secondary | ICD-10-CM | POA: Diagnosis not present

## 2021-12-18 DIAGNOSIS — C44319 Basal cell carcinoma of skin of other parts of face: Secondary | ICD-10-CM | POA: Diagnosis not present

## 2021-12-18 DIAGNOSIS — B078 Other viral warts: Secondary | ICD-10-CM | POA: Diagnosis not present

## 2021-12-18 DIAGNOSIS — Z08 Encounter for follow-up examination after completed treatment for malignant neoplasm: Secondary | ICD-10-CM | POA: Diagnosis not present

## 2021-12-18 DIAGNOSIS — Z85828 Personal history of other malignant neoplasm of skin: Secondary | ICD-10-CM | POA: Diagnosis not present

## 2022-01-16 DIAGNOSIS — H524 Presbyopia: Secondary | ICD-10-CM | POA: Diagnosis not present

## 2022-01-16 DIAGNOSIS — H33021 Retinal detachment with multiple breaks, right eye: Secondary | ICD-10-CM | POA: Diagnosis not present

## 2022-01-16 DIAGNOSIS — Z961 Presence of intraocular lens: Secondary | ICD-10-CM | POA: Diagnosis not present

## 2022-01-16 DIAGNOSIS — H26493 Other secondary cataract, bilateral: Secondary | ICD-10-CM | POA: Diagnosis not present

## 2022-01-16 DIAGNOSIS — H18832 Recurrent erosion of cornea, left eye: Secondary | ICD-10-CM | POA: Diagnosis not present

## 2022-01-29 DIAGNOSIS — I10 Essential (primary) hypertension: Secondary | ICD-10-CM | POA: Diagnosis not present

## 2022-01-29 DIAGNOSIS — Z0001 Encounter for general adult medical examination with abnormal findings: Secondary | ICD-10-CM | POA: Diagnosis not present

## 2022-01-29 DIAGNOSIS — L570 Actinic keratosis: Secondary | ICD-10-CM | POA: Diagnosis not present

## 2022-01-29 DIAGNOSIS — E782 Mixed hyperlipidemia: Secondary | ICD-10-CM | POA: Diagnosis not present

## 2022-01-29 DIAGNOSIS — I4819 Other persistent atrial fibrillation: Secondary | ICD-10-CM | POA: Diagnosis not present

## 2022-01-29 DIAGNOSIS — N182 Chronic kidney disease, stage 2 (mild): Secondary | ICD-10-CM | POA: Diagnosis not present

## 2022-02-22 DIAGNOSIS — C44319 Basal cell carcinoma of skin of other parts of face: Secondary | ICD-10-CM | POA: Diagnosis not present

## 2022-02-22 DIAGNOSIS — X32XXXD Exposure to sunlight, subsequent encounter: Secondary | ICD-10-CM | POA: Diagnosis not present

## 2022-02-22 DIAGNOSIS — L570 Actinic keratosis: Secondary | ICD-10-CM | POA: Diagnosis not present

## 2022-03-29 DIAGNOSIS — C44319 Basal cell carcinoma of skin of other parts of face: Secondary | ICD-10-CM | POA: Diagnosis not present

## 2022-04-17 ENCOUNTER — Other Ambulatory Visit (HOSPITAL_COMMUNITY): Payer: Self-pay | Admitting: Nephrology

## 2022-04-17 DIAGNOSIS — N1832 Chronic kidney disease, stage 3b: Secondary | ICD-10-CM | POA: Diagnosis not present

## 2022-04-17 DIAGNOSIS — I129 Hypertensive chronic kidney disease with stage 1 through stage 4 chronic kidney disease, or unspecified chronic kidney disease: Secondary | ICD-10-CM

## 2022-04-17 DIAGNOSIS — N281 Cyst of kidney, acquired: Secondary | ICD-10-CM | POA: Diagnosis not present

## 2022-04-17 DIAGNOSIS — I480 Paroxysmal atrial fibrillation: Secondary | ICD-10-CM | POA: Diagnosis not present

## 2022-04-26 DIAGNOSIS — I480 Paroxysmal atrial fibrillation: Secondary | ICD-10-CM | POA: Diagnosis not present

## 2022-04-26 DIAGNOSIS — N1832 Chronic kidney disease, stage 3b: Secondary | ICD-10-CM | POA: Diagnosis not present

## 2022-04-26 DIAGNOSIS — N281 Cyst of kidney, acquired: Secondary | ICD-10-CM | POA: Diagnosis not present

## 2022-04-26 DIAGNOSIS — I129 Hypertensive chronic kidney disease with stage 1 through stage 4 chronic kidney disease, or unspecified chronic kidney disease: Secondary | ICD-10-CM | POA: Diagnosis not present

## 2022-05-01 ENCOUNTER — Ambulatory Visit (HOSPITAL_COMMUNITY)
Admission: RE | Admit: 2022-05-01 | Discharge: 2022-05-01 | Disposition: A | Discharge status: Home / Self Care | Payer: Medicare Other | Source: Ambulatory Visit | Attending: Nephrology | Admitting: Nephrology

## 2022-05-01 DIAGNOSIS — I129 Hypertensive chronic kidney disease with stage 1 through stage 4 chronic kidney disease, or unspecified chronic kidney disease: Secondary | ICD-10-CM | POA: Diagnosis not present

## 2022-05-01 DIAGNOSIS — N1832 Chronic kidney disease, stage 3b: Secondary | ICD-10-CM | POA: Insufficient documentation

## 2022-05-01 DIAGNOSIS — N281 Cyst of kidney, acquired: Secondary | ICD-10-CM | POA: Diagnosis not present

## 2022-05-01 DIAGNOSIS — N189 Chronic kidney disease, unspecified: Secondary | ICD-10-CM | POA: Diagnosis not present

## 2022-05-03 DIAGNOSIS — C4401 Basal cell carcinoma of skin of lip: Secondary | ICD-10-CM | POA: Diagnosis not present

## 2022-05-06 ENCOUNTER — Other Ambulatory Visit: Payer: Self-pay | Admitting: Cardiology

## 2022-05-07 NOTE — Telephone Encounter (Addendum)
Prescription refill request for Xarelto received.  Indication: Afib  Last office visit: 05/30/21 ([place])  Weight: 82.6kg Age: 72 Scr: 1.15 (04/26/22 via [facility])  CrCl: 56.44m/min  Appropriate dose. Refill sent.

## 2022-05-15 DIAGNOSIS — R7303 Prediabetes: Secondary | ICD-10-CM | POA: Diagnosis not present

## 2022-05-15 DIAGNOSIS — N1831 Chronic kidney disease, stage 3a: Secondary | ICD-10-CM | POA: Diagnosis not present

## 2022-05-15 DIAGNOSIS — I129 Hypertensive chronic kidney disease with stage 1 through stage 4 chronic kidney disease, or unspecified chronic kidney disease: Secondary | ICD-10-CM | POA: Diagnosis not present

## 2022-05-15 DIAGNOSIS — R768 Other specified abnormal immunological findings in serum: Secondary | ICD-10-CM | POA: Diagnosis not present

## 2022-05-31 DIAGNOSIS — Z85828 Personal history of other malignant neoplasm of skin: Secondary | ICD-10-CM | POA: Diagnosis not present

## 2022-05-31 DIAGNOSIS — L570 Actinic keratosis: Secondary | ICD-10-CM | POA: Diagnosis not present

## 2022-05-31 DIAGNOSIS — Z08 Encounter for follow-up examination after completed treatment for malignant neoplasm: Secondary | ICD-10-CM | POA: Diagnosis not present

## 2022-05-31 DIAGNOSIS — X32XXXD Exposure to sunlight, subsequent encounter: Secondary | ICD-10-CM | POA: Diagnosis not present

## 2022-07-02 ENCOUNTER — Ambulatory Visit: Payer: Medicare Other | Attending: Cardiology | Admitting: Cardiology

## 2022-07-02 ENCOUNTER — Encounter: Payer: Self-pay | Admitting: Cardiology

## 2022-07-02 VITALS — BP 124/80 | HR 80 | Ht 60.0 in | Wt 175.0 lb

## 2022-07-02 DIAGNOSIS — D6869 Other thrombophilia: Secondary | ICD-10-CM | POA: Diagnosis not present

## 2022-07-02 DIAGNOSIS — I4811 Longstanding persistent atrial fibrillation: Secondary | ICD-10-CM

## 2022-07-02 DIAGNOSIS — I10 Essential (primary) hypertension: Secondary | ICD-10-CM | POA: Diagnosis not present

## 2022-07-02 NOTE — Patient Instructions (Addendum)

## 2022-07-02 NOTE — Progress Notes (Signed)
Clinical Summary Ms. Laughner is a 72 y.o.female seen today for follow up of the following medical problems.    1.Longstanding Persistent Afib CHADS2Vasc (age, gender, HTN) is 3 she is on xarelto CrCl 43      - s/p DCCV Jan 24,2020 for new diagnosis of persistent afib. Reverted back to afib shortly after. Undergoing rate control strategy     - no recent palpitations - compliant with meds - no bleeding on xarelto. Today CrCl is 55      2. HTN -she is compliant with meds       SH: crochets as hobby for charities Has several animals at home including cats, dogs, birds Has son lives outside [place].      Past Medical History:  Diagnosis Date   Anxiety    Atrial fibrillation 12/2017   Depression    Dysrhythmia    Hypertension      No Known Allergies   Current Outpatient Medications  Medication Sig Dispense Refill   acetaminophen (TYLENOL) 325 MG tablet Take 650 mg by mouth every 6 (six) hours as needed for moderate pain.     cholecalciferol (VITAMIN D3) 25 MCG (1000 UT) tablet Take 1,000 Units by mouth daily.     diphenhydrAMINE (BENADRYL) 25 MG tablet Take 25 mg by mouth daily.      hydrochlorothiazide (MICROZIDE) 12.5 MG capsule Take 12.5 mg by mouth daily.     losartan (COZAAR) 100 MG tablet TAKE 1 TABLET BY MOUTH  DAILY 90 tablet 3   Melatonin 10 MG CAPS Take 10 mg by mouth at bedtime.      Menthol-Methyl Salicylate (TIGER BALM LINIMENT EX) Apply 1 application topically daily as needed (joint pain).     metoprolol tartrate (LOPRESSOR) 25 MG tablet TAKE 1 AND 1/2 TABLETS BY  MOUTH TWICE DAILY 270 tablet 3   Omega-3 Fatty Acids (FISH OIL) 1000 MG CAPS Take 1,000 mg by mouth daily.      XARELTO 20 MG TABS tablet TAKE 1 TABLET BY MOUTH DAILY  WITH SUPPER 100 tablet 2   No current facility-administered medications for this visit.     Past Surgical History:  Procedure Laterality Date   CARDIOVERSION N/A 04/04/2018   Procedure: CARDIOVERSION;  Surgeon:  Antoine Poche, MD;  Location: [facility];  Service: Endoscopy;  Laterality: N/A;   RETINAL DETACHMENT SURGERY  09/2010   TONSILLECTOMY     TUBAL LIGATION       No Known Allergies    Family History  Problem Relation Age of Onset   Heart attack Father    CAD Father    Cancer Brother      Social History Ms. Jentz reports that she quit smoking about 30 years ago. Her smoking use included cigarettes. She smoked an average of 1 pack per day. She has never used smokeless tobacco. Ms. Aven reports that she does not currently use alcohol.   Review of Systems CONSTITUTIONAL: No weight loss, fever, chills, weakness or fatigue.  HEENT: Eyes: No visual loss, blurred vision, double vision or yellow sclerae.No hearing loss, sneezing, congestion, runny nose or sore throat.  SKIN: No rash or itching.  CARDIOVASCULAR: per hpi RESPIRATORY: No shortness of breath, cough or sputum.  GASTROINTESTINAL: No anorexia, nausea, vomiting or diarrhea. No abdominal pain or blood.  GENITOURINARY: No burning on urination, no polyuria NEUROLOGICAL: No headache, dizziness, syncope, paralysis, ataxia, numbness or tingling in the extremities. No change in bowel or bladder control.  MUSCULOSKELETAL: No  muscle, back pain, joint pain or stiffness.  LYMPHATICS: No enlarged nodes. No history of splenectomy.  PSYCHIATRIC: No history of depression or anxiety.  ENDOCRINOLOGIC: No reports of sweating, cold or heat intolerance. No polyuria or polydipsia.  Marland Kitchen   Physical Examination Vitals:   07/02/22 1535 07/02/22 1615  BP: (!) 126/90 124/80  Pulse: 80   SpO2: 98%    Filed Weights   07/02/22 1535  Weight: 175 lb (79.4 kg)    Gen: resting comfortably, no acute distress HEENT: no scleral icterus, pupils equal round and reactive, no palptable cervical adenopathy,  CV: irreg, no mrg, no jvd Resp: Clear to auscultation bilaterally GI: abdomen is soft, non-tender, non-distended, normal bowel sounds, no  hepatosplenomegaly MSK: extremities are warm, no edema.  Skin: warm, no rash Neuro:  no focal deficits Psych: appropriate affect   Diagnostic Studies 02/2018 echo Study Conclusions   - Left ventricle: The cavity size was normal. Wall thickness was   increased in a pattern of mild LVH. Systolic function was normal.   The estimated ejection fraction was in the range of 55% to 60%.   Wall motion was normal; there were no regional wall motion   abnormalities. The study was not technically sufficient to allow   evaluation of LV diastolic dysfunction due to atrial   fibrillation. - Mitral valve: There was mild regurgitation.    Assessment and Plan   1. Longstanding persistent Afib/acquired thrombophilia - s/p DCCV, reverted back to afib. Has done well with just rate control -no recent symptoms - continue current meds including xarelto for stroke prevention - EKG today shows rate controlled afib     2. HTN - bp is at goal, continue current meds       Antoine Poche, M.D.

## 2022-07-15 ENCOUNTER — Other Ambulatory Visit: Payer: Self-pay | Admitting: Cardiology

## 2022-08-15 DIAGNOSIS — I400 Infective myocarditis: Secondary | ICD-10-CM | POA: Diagnosis not present

## 2022-08-15 DIAGNOSIS — N1831 Chronic kidney disease, stage 3a: Secondary | ICD-10-CM | POA: Diagnosis not present

## 2022-08-15 DIAGNOSIS — R768 Other specified abnormal immunological findings in serum: Secondary | ICD-10-CM | POA: Diagnosis not present

## 2022-08-15 DIAGNOSIS — N189 Chronic kidney disease, unspecified: Secondary | ICD-10-CM | POA: Diagnosis not present

## 2022-08-15 DIAGNOSIS — I129 Hypertensive chronic kidney disease with stage 1 through stage 4 chronic kidney disease, or unspecified chronic kidney disease: Secondary | ICD-10-CM | POA: Diagnosis not present

## 2022-08-20 DIAGNOSIS — R7303 Prediabetes: Secondary | ICD-10-CM | POA: Diagnosis not present

## 2022-08-20 DIAGNOSIS — I480 Paroxysmal atrial fibrillation: Secondary | ICD-10-CM | POA: Diagnosis not present

## 2022-08-20 DIAGNOSIS — N1831 Chronic kidney disease, stage 3a: Secondary | ICD-10-CM | POA: Diagnosis not present

## 2022-11-07 DIAGNOSIS — I10 Essential (primary) hypertension: Secondary | ICD-10-CM | POA: Diagnosis not present

## 2022-11-07 DIAGNOSIS — R42 Dizziness and giddiness: Secondary | ICD-10-CM | POA: Diagnosis not present

## 2022-11-19 DIAGNOSIS — N183 Chronic kidney disease, stage 3 unspecified: Secondary | ICD-10-CM | POA: Diagnosis not present

## 2022-11-19 DIAGNOSIS — Z87891 Personal history of nicotine dependence: Secondary | ICD-10-CM | POA: Diagnosis not present

## 2022-11-19 DIAGNOSIS — Z299 Encounter for prophylactic measures, unspecified: Secondary | ICD-10-CM | POA: Diagnosis not present

## 2022-11-19 DIAGNOSIS — I4891 Unspecified atrial fibrillation: Secondary | ICD-10-CM | POA: Diagnosis not present

## 2022-11-19 DIAGNOSIS — R42 Dizziness and giddiness: Secondary | ICD-10-CM | POA: Diagnosis not present

## 2022-11-19 DIAGNOSIS — I10 Essential (primary) hypertension: Secondary | ICD-10-CM | POA: Diagnosis not present

## 2023-02-02 ENCOUNTER — Other Ambulatory Visit: Payer: Self-pay | Admitting: Cardiology

## 2023-02-04 NOTE — Telephone Encounter (Signed)
Prescription refill request for Xarelto received.  Indication: AF Last office visit: 07/02/22  Dominga Ferry MD Weight: 79.4kg Age: 72 Scr: 1.04 on 08/15/22  [facility] CrCl:  59.49  Based on above findings Xarelto 20mg  daily is the appropriate dose.  Refill approved.

## 2023-02-05 DIAGNOSIS — Z7189 Other specified counseling: Secondary | ICD-10-CM | POA: Diagnosis not present

## 2023-02-05 DIAGNOSIS — E7800 Pure hypercholesterolemia, unspecified: Secondary | ICD-10-CM | POA: Diagnosis not present

## 2023-02-05 DIAGNOSIS — R5383 Other fatigue: Secondary | ICD-10-CM | POA: Diagnosis not present

## 2023-02-05 DIAGNOSIS — I10 Essential (primary) hypertension: Secondary | ICD-10-CM | POA: Diagnosis not present

## 2023-02-05 DIAGNOSIS — Z0000 Encounter for general adult medical examination without abnormal findings: Secondary | ICD-10-CM | POA: Diagnosis not present

## 2023-02-05 DIAGNOSIS — Z299 Encounter for prophylactic measures, unspecified: Secondary | ICD-10-CM | POA: Diagnosis not present

## 2023-02-06 DIAGNOSIS — Z79899 Other long term (current) drug therapy: Secondary | ICD-10-CM | POA: Diagnosis not present

## 2023-02-06 DIAGNOSIS — R5383 Other fatigue: Secondary | ICD-10-CM | POA: Diagnosis not present

## 2023-02-06 DIAGNOSIS — E559 Vitamin D deficiency, unspecified: Secondary | ICD-10-CM | POA: Diagnosis not present

## 2023-02-06 DIAGNOSIS — E7800 Pure hypercholesterolemia, unspecified: Secondary | ICD-10-CM | POA: Diagnosis not present

## 2023-02-13 DIAGNOSIS — I480 Paroxysmal atrial fibrillation: Secondary | ICD-10-CM | POA: Diagnosis not present

## 2023-02-13 DIAGNOSIS — R7303 Prediabetes: Secondary | ICD-10-CM | POA: Diagnosis not present

## 2023-02-13 DIAGNOSIS — I129 Hypertensive chronic kidney disease with stage 1 through stage 4 chronic kidney disease, or unspecified chronic kidney disease: Secondary | ICD-10-CM | POA: Diagnosis not present

## 2023-02-13 DIAGNOSIS — N1831 Chronic kidney disease, stage 3a: Secondary | ICD-10-CM | POA: Diagnosis not present

## 2023-02-13 DIAGNOSIS — N189 Chronic kidney disease, unspecified: Secondary | ICD-10-CM | POA: Diagnosis not present

## 2023-02-21 DIAGNOSIS — I129 Hypertensive chronic kidney disease with stage 1 through stage 4 chronic kidney disease, or unspecified chronic kidney disease: Secondary | ICD-10-CM | POA: Diagnosis not present

## 2023-02-21 DIAGNOSIS — N1831 Chronic kidney disease, stage 3a: Secondary | ICD-10-CM | POA: Diagnosis not present

## 2023-02-21 DIAGNOSIS — I480 Paroxysmal atrial fibrillation: Secondary | ICD-10-CM | POA: Diagnosis not present

## 2023-03-22 DIAGNOSIS — Z13820 Encounter for screening for osteoporosis: Secondary | ICD-10-CM | POA: Diagnosis not present

## 2023-04-03 ENCOUNTER — Other Ambulatory Visit (HOSPITAL_COMMUNITY): Payer: Self-pay | Admitting: Student

## 2023-04-03 DIAGNOSIS — Z1231 Encounter for screening mammogram for malignant neoplasm of breast: Secondary | ICD-10-CM

## 2023-04-11 ENCOUNTER — Ambulatory Visit (HOSPITAL_COMMUNITY)
Admission: RE | Admit: 2023-04-11 | Discharge: 2023-04-11 | Disposition: A | Discharge status: Home / Self Care | Payer: Medicare Other | Source: Ambulatory Visit | Attending: Student | Admitting: Student

## 2023-04-11 ENCOUNTER — Other Ambulatory Visit (HOSPITAL_COMMUNITY): Payer: Self-pay | Admitting: Student

## 2023-04-11 ENCOUNTER — Encounter (HOSPITAL_COMMUNITY): Payer: Self-pay

## 2023-04-11 DIAGNOSIS — Z1231 Encounter for screening mammogram for malignant neoplasm of breast: Secondary | ICD-10-CM | POA: Diagnosis not present

## 2023-05-09 DIAGNOSIS — M7522 Bicipital tendinitis, left shoulder: Secondary | ICD-10-CM | POA: Diagnosis not present

## 2023-05-09 DIAGNOSIS — Z299 Encounter for prophylactic measures, unspecified: Secondary | ICD-10-CM | POA: Diagnosis not present

## 2023-05-09 DIAGNOSIS — R52 Pain, unspecified: Secondary | ICD-10-CM | POA: Diagnosis not present

## 2023-05-09 DIAGNOSIS — N183 Chronic kidney disease, stage 3 unspecified: Secondary | ICD-10-CM | POA: Diagnosis not present

## 2023-05-09 DIAGNOSIS — I4891 Unspecified atrial fibrillation: Secondary | ICD-10-CM | POA: Diagnosis not present

## 2023-05-09 DIAGNOSIS — I10 Essential (primary) hypertension: Secondary | ICD-10-CM | POA: Diagnosis not present

## 2023-08-08 DIAGNOSIS — N189 Chronic kidney disease, unspecified: Secondary | ICD-10-CM | POA: Diagnosis not present

## 2023-08-08 DIAGNOSIS — D631 Anemia in chronic kidney disease: Secondary | ICD-10-CM | POA: Diagnosis not present

## 2023-08-08 DIAGNOSIS — E211 Secondary hyperparathyroidism, not elsewhere classified: Secondary | ICD-10-CM | POA: Diagnosis not present

## 2023-08-08 DIAGNOSIS — R809 Proteinuria, unspecified: Secondary | ICD-10-CM | POA: Diagnosis not present

## 2023-09-09 ENCOUNTER — Other Ambulatory Visit: Payer: Self-pay | Admitting: Cardiology

## 2023-09-09 NOTE — Telephone Encounter (Signed)
 Prescription refill request for Xarelto  received.  Indication:afib Last office visit:needs appt Weight:79.4  kg Age:73 Scr:na CrCl:na  Prescription refilled

## 2023-11-07 DIAGNOSIS — Z299 Encounter for prophylactic measures, unspecified: Secondary | ICD-10-CM | POA: Diagnosis not present

## 2023-11-07 DIAGNOSIS — I4891 Unspecified atrial fibrillation: Secondary | ICD-10-CM | POA: Diagnosis not present

## 2023-11-07 DIAGNOSIS — N183 Chronic kidney disease, stage 3 unspecified: Secondary | ICD-10-CM | POA: Diagnosis not present

## 2023-11-07 DIAGNOSIS — I10 Essential (primary) hypertension: Secondary | ICD-10-CM | POA: Diagnosis not present

## 2023-11-19 ENCOUNTER — Encounter: Payer: Self-pay | Admitting: Cardiology

## 2023-11-19 ENCOUNTER — Ambulatory Visit: Attending: Cardiology | Admitting: Cardiology

## 2023-11-19 VITALS — BP 132/78 | HR 83 | Ht 59.0 in | Wt 176.6 lb

## 2023-11-19 DIAGNOSIS — I4811 Longstanding persistent atrial fibrillation: Secondary | ICD-10-CM | POA: Diagnosis not present

## 2023-11-19 DIAGNOSIS — D6869 Other thrombophilia: Secondary | ICD-10-CM

## 2023-11-19 DIAGNOSIS — I10 Essential (primary) hypertension: Secondary | ICD-10-CM | POA: Diagnosis not present

## 2023-11-19 MED ORDER — METOPROLOL TARTRATE 25 MG PO TABS: 37.5000 mg | ORAL_TABLET | Freq: Two times a day (BID) | ORAL | 3 refills | Status: AC

## 2023-11-19 MED ORDER — RIVAROXABAN 20 MG PO TABS: 20.0000 mg | ORAL_TABLET | Freq: Every day | ORAL | 3 refills | Status: AC

## 2023-11-19 MED ORDER — HYDROCHLOROTHIAZIDE 12.5 MG PO CAPS: 12.5000 mg | ORAL_CAPSULE | Freq: Every day | ORAL | 3 refills | Status: AC

## 2023-11-19 MED ORDER — LOSARTAN POTASSIUM 100 MG PO TABS: 100.0000 mg | ORAL_TABLET | Freq: Every day | ORAL | 3 refills | Status: AC

## 2023-11-19 NOTE — Patient Instructions (Signed)
 Medication Instructions:   Hydrochlorothiazide , Losartan , Xarelto , Metoprolol  refilled today  Continue all other medications.     Labwork:  none  Testing/Procedures:  none  Follow-Up:  Your physician wants you to follow up in:  1 year.  You should receive a recall letter in the mail about 2 months prior to the time you are due.  If you don't receive this, please call our office to schedule your follow up appointment.      Any Other Special Instructions Will Be Listed Below (If Applicable).   If you need a refill on your cardiac medications before your next appointment, please call your pharmacy.

## 2023-11-19 NOTE — Progress Notes (Signed)
 Clinical Summary Stephanie Hale is a 73 y.o.female seen today for follow up of the following medical problems.    1.Longstanding Persistent Afib CHADS2Vasc (age, gender, HTN) is 3 she is on xarelto  CrCl 52     - s/p DCCV Jan 24,2020 for new diagnosis of persistent afib. Reverted back to afib shortly after. Undergoing rate control strategy  -denies any palpitations - compliant with meds.  - no bleeding on xarelto . CrCl is 52, she is on appropriate dose of xarelto  20mg  daily .     2. HTN -compliant with meds        SH: crochets as hobby for charities Has several animals at home including cats, dogs, birds.Just lost 2 dogs, just got a 28 year old yorkie Has son lives outside [place].  Past Medical History:  Diagnosis Date   Anxiety    Atrial fibrillation (HCC) 12/2017   Depression    Dysrhythmia    Hypertension      No Known Allergies   Current Outpatient Medications  Medication Sig Dispense Refill   acetaminophen (TYLENOL) 325 MG tablet Take 650 mg by mouth every 6 (six) hours as needed for moderate pain.     Calcium Carbonate (CALCIUM 600 PO) Take 300 mg by mouth daily.     cholecalciferol (VITAMIN D3) 25 MCG (1000 UT) tablet Take 1,000 Units by mouth daily. (Patient taking differently: Take 1,000 Units by mouth 2 (two) times daily.)     diphenhydrAMINE (BENADRYL) 25 MG tablet Take 25 mg by mouth daily.      famotidine (PEPCID) 20 MG tablet Take 20 mg by mouth daily.     hydrochlorothiazide  (MICROZIDE ) 12.5 MG capsule Take 12.5 mg by mouth daily.     losartan  (COZAAR ) 100 MG tablet TAKE 1 TABLET BY MOUTH DAILY 100 tablet 2   Melatonin 10 MG CAPS Take 10 mg by mouth at bedtime.      Menthol-Methyl Salicylate (TIGER BALM LINIMENT EX) Apply 1 application topically daily as needed (joint pain).     metoprolol  tartrate (LOPRESSOR ) 25 MG tablet TAKE 1 AND 1/2 TABLETS BY MOUTH  TWICE DAILY 300 tablet 2   Omega-3 Fatty Acids (FISH OIL) 1000 MG CAPS Take 1,000 mg by mouth  daily.      rivaroxaban  (XARELTO ) 20 MG TABS tablet Take 1 tablet (20 mg total) by mouth daily with supper. NEEDS CARDIOLOGY APPT FOR REFILLS, CALL OFFICE.  THANK YOU 30 tablet 0   No current facility-administered medications for this visit.     Past Surgical History:  Procedure Laterality Date   BREAST BIOPSY Right 11/05/2013   hyalinizing fibroadenoma   CARDIOVERSION N/A 04/04/2018   Procedure: CARDIOVERSION;  Surgeon: Alvan Stephanie FALCON, MD;  Location: [facility];  Service: Endoscopy;  Laterality: N/A;   RETINAL DETACHMENT SURGERY  09/2010   TONSILLECTOMY     TUBAL LIGATION       No Known Allergies    Family History  Problem Relation Age of Onset   Heart attack Father    CAD Father    Cancer Brother      Social History Stephanie Hale reports that she quit smoking about 31 years ago. Her smoking use included cigarettes. She has never used smokeless tobacco. Stephanie Hale reports that she does not currently use alcohol.   Physical Examination Today's Vitals   11/19/23 1536 11/19/23 1558  BP: (!) 142/98 132/78  Pulse: 83   SpO2: 95%   Weight: 176 lb 9.6 oz (80.1 kg)  Height: 4' 11 (1.499 m)    Body mass index is 35.67 kg/m.  Gen: resting comfortably, no acute distress HEENT: no scleral icterus, pupils equal round and reactive, no palptable cervical adenopathy,  CV: irreg, no m/rg, no jvd Resp: Clear to auscultation bilaterally GI: abdomen is soft, non-tender, non-distended, normal bowel sounds, no hepatosplenomegaly MSK: extremities are warm, no edema.  Skin: warm, no rash Neuro:  no focal deficits Psych: appropriate affect   Diagnostic Studies  02/2018 echo Study Conclusions   - Left ventricle: The cavity size was normal. Wall thickness was   increased in a pattern of mild LVH. Systolic function was normal.   The estimated ejection fraction was in the range of 55% to 60%.   Wall motion was normal; there were no regional wall motion   abnormalities. The  study was not technically sufficient to allow   evaluation of LV diastolic dysfunction due to atrial   fibrillation. - Mitral valve: There was mild regurgitation.     Assessment and Plan   1. Longstanding persistent Afib/acquired thrombophilia - s/p DCCV, reverted back to afib. Has done well with just rate control -no symptoms, continue current meds including xarelto  for stroke prevention     2. HTN - bp at goal, continue current meds   F/u 1 year     Stephanie Hale, M.D.
